# Patient Record
Sex: Male | Born: 1990 | Race: White | Hispanic: No | Marital: Single | Smoking: Never smoker
Health system: Southern US, Community
[De-identification: ages and names within clinical notes are randomized; demographics above are authoritative.]

## PROBLEM LIST (undated history)

## (undated) DIAGNOSIS — F319 Bipolar disorder, unspecified: Secondary | ICD-10-CM

## (undated) DIAGNOSIS — F419 Anxiety disorder, unspecified: Secondary | ICD-10-CM

## (undated) DIAGNOSIS — F1910 Other psychoactive substance abuse, uncomplicated: Secondary | ICD-10-CM

## (undated) DIAGNOSIS — F32A Depression, unspecified: Secondary | ICD-10-CM

## (undated) DIAGNOSIS — F909 Attention-deficit hyperactivity disorder, unspecified type: Secondary | ICD-10-CM

## (undated) HISTORY — DX: Depression, unspecified: F32.A

## (undated) HISTORY — DX: Other psychoactive substance abuse, uncomplicated: F19.10

## (undated) HISTORY — DX: Anxiety disorder, unspecified: F41.9

## (undated) HISTORY — DX: Bipolar disorder, unspecified: F31.9

## (undated) HISTORY — DX: Attention-deficit hyperactivity disorder, unspecified type: F90.9

---
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
## 2015-02-08 ENCOUNTER — Ambulatory Visit (INDEPENDENT_AMBULATORY_CARE_PROVIDER_SITE_OTHER): Payer: BLUE CROSS/BLUE SHIELD | Admitting: Physician Assistant

## 2015-02-08 DIAGNOSIS — F988 Other specified behavioral and emotional disorders with onset usually occurring in childhood and adolescence: Secondary | ICD-10-CM | POA: Insufficient documentation

## 2015-02-08 DIAGNOSIS — F909 Attention-deficit hyperactivity disorder, unspecified type: Secondary | ICD-10-CM

## 2015-02-08 DIAGNOSIS — L649 Androgenic alopecia, unspecified: Secondary | ICD-10-CM | POA: Insufficient documentation

## 2015-02-08 MED ORDER — AMPHETAMINE-DEXTROAMPHETAMINE 10 MG PO TABS: 10.0000 mg | ORAL_TABLET | Freq: Two times a day (BID) | ORAL | Status: DC

## 2015-02-08 NOTE — Progress Notes (Signed)
   Jared Delacruz  MRN: [id number] DOB: 1990/12/04  Subjective:  Pt presents to clinic for evaluation of possible ADD medications.  Easily distracted, inattentiveness - trouble getting tasks started and trouble getting them finished -   typical work day - 8-5ish   Was using caffiene some but it started affecting his sleep so he stopped that a while ago.   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  He is not sure when he plans to see him next.   Patient Active Problem List   Diagnosis Date Noted  . 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 ADD (attention deficit disorder) 02/08/2015    No current outpatient prescriptions on file prior to visit.   No current facility-administered medications on file prior to visit.    No Known Allergies  Review of Systems  Psychiatric/Behavioral: Positive for decreased concentration. Negative for dysphoric mood. The patient is not nervous/anxious.    Objective:  BP 128/70 mmHg  Pulse 74  Temp(Src) 99 F (37.2 C) (Oral)  Resp 16  Ht  (1.753 m)  Wt 178 lb 12.8 oz (81.103 kg)  BMI 26.39 kg/m2  SpO2 99%  Physical Exam  Constitutional: He is oriented to person, place, and time and well-developed, well-nourished, and in no distress.  HENT:  Head: Normocephalic and atraumatic.  Right Ear: External ear normal.  Left Ear: External ear normal.  Eyes: Conjunctivae are normal.  Neck: Normal range of motion.  Cardiovascular: Normal rate, regular rhythm and normal heart sounds.   No murmur heard. Pulmonary/Chest: Effort normal and breath sounds normal.  Neurological: He is alert and oriented to person, place, and time. Gait normal.  Skin: Skin is warm and dry.  Psychiatric: Mood,  memory, affect and judgment normal.    Assessment and Plan :  ADD (attention deficit disorder) - Plan: amphetamine-dextroamphetamine (ADDERALL) 10 MG tablet   Start Adderall - titration discussed with patient as well as our controlled substance policy.  He will contact me in 2 weeks to tell me where he is and how he is responding and then he will recheck with me in a month.  Benny Lennert PA-C  [facility] 02/13/2015 9:45 AM

## 2015-02-08 NOTE — Patient Instructions (Signed)
[facility] Policy for Prescribing Controlled Substances (Revised 04/2012) 1. Prescriptions for controlled substances will be filled by ONE provider at [facility] with whom you have established and developed a plan for your care, including follow-up. 2. You are encouraged to schedule an appointment with your prescriber at our appointment center for follow-up visits whenever possible. 3. If you request a prescription for the controlled substance while at [facility] for an acute problem (with someone other than your regular prescriber), you MAY be given a ONE-TIME prescription for a 30-day supply of the controlled substance, to allow time for you to return to see your regular prescriber for additional prescriptions.  Sign up for mychart

## 2015-03-15 ENCOUNTER — Encounter: Payer: Self-pay | Admitting: Physician Assistant

## 2015-03-15 ENCOUNTER — Ambulatory Visit (INDEPENDENT_AMBULATORY_CARE_PROVIDER_SITE_OTHER): Payer: BLUE CROSS/BLUE SHIELD | Admitting: Physician Assistant

## 2015-03-15 VITALS — BP 116/70 | HR 90 | Temp 98.8°F | Resp 16 | Ht 68.75 in | Wt 175.2 lb

## 2015-03-15 DIAGNOSIS — F909 Attention-deficit hyperactivity disorder, unspecified type: Secondary | ICD-10-CM

## 2015-03-15 DIAGNOSIS — F988 Other specified behavioral and emotional disorders with onset usually occurring in childhood and adolescence: Secondary | ICD-10-CM

## 2015-03-15 MED ORDER — AMPHETAMINE-DEXTROAMPHETAMINE 10 MG PO TABS: 10.0000 mg | ORAL_TABLET | Freq: Two times a day (BID) | ORAL | Status: DC

## 2015-03-15 NOTE — Patient Instructions (Addendum)
Continue to titrate the adderall - recheck in a month

## 2015-03-15 NOTE — Progress Notes (Signed)
   Jared Delacruz  MRN: [id number] DOB: April 24, 1991  Subjective:  Pt presents to clinic for ADD recheck.  He has been trying different doses of his adderall and still not completely sure what the best dose is for him and he feels like he needs to do more research.  He has tried  and he feels a difference that lasts about an hour but he has also tried  and that helps a lot more with the same time frame of help.  He has tried adding coffee helps but it messes up his sleep and aggravates his anxiety.  He has found that for his Anxiety - magnesium glycinate   -  helps with anxiety symptoms - he has been taking that for about a month and he has really noticed a difference in his anxiety.   Patient Active Problem List   Diagnosis Date Noted  . 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 ADD (attention deficit disorder) 02/08/2015    Current Outpatient Prescriptions on File Prior to Visit  Medication Sig Dispense Refill  . finasteride (PROPECIA) 1 MG tablet Take 1 mg by mouth daily.     No current facility-administered medications on file prior to visit.    No Known Allergies  Review of Systems  Psychiatric/Behavioral: Positive for decreased concentration. The patient is nervous/anxious.    Objective:  BP 116/70 mmHg  Pulse 90  Temp(Src) 98.8 F (37.1 C) (Oral)  Resp 16  Ht 5' 8.75" (1.746 m)  Wt 175 lb 3.2 oz (79.47 kg)  BMI 26.07 kg/m2  SpO2 97%  Physical Exam  Constitutional: He is oriented to person, place, and time and well-developed, well-nourished, and in no distress.  HENT:  Head: Normocephalic and atraumatic.  Right Ear: External ear normal.  Left Ear: External ear normal.  Eyes: Conjunctivae are normal.  Neck: Normal range of motion.  Pulmonary/Chest: Effort normal.  Neurological: He is alert and oriented to person, place, and time. Gait normal.  Skin: Skin is warm and dry.  Psychiatric: Mood, memory, affect and judgment normal.    Assessment and Plan :  ADD  (attention deficit disorder) - Plan: amphetamine-dextroamphetamine (ADDERALL) 10 MG tablet   Pt plans to continue with his titration to figure out the best dose for him - he can go from  -  up to bid and he will recheck with me in a month  Benny Lennert PA-C  [facility] 03/16/2015 11:43 AM

## 2015-04-19 ENCOUNTER — Telehealth: Payer: Self-pay | Admitting: *Deleted

## 2015-04-19 ENCOUNTER — Ambulatory Visit: Payer: Self-pay | Admitting: Physician Assistant

## 2015-04-19 NOTE — Telephone Encounter (Signed)
Patient missed appt., left message to call the office to resche appt.

## 2015-05-22 ENCOUNTER — Ambulatory Visit (INDEPENDENT_AMBULATORY_CARE_PROVIDER_SITE_OTHER): Payer: BLUE CROSS/BLUE SHIELD | Admitting: Physician Assistant

## 2015-05-22 ENCOUNTER — Encounter: Payer: Self-pay | Admitting: Physician Assistant

## 2015-05-22 VITALS — BP 130/78 | HR 80 | Temp 98.0°F | Resp 16 | Ht 69.0 in | Wt 174.0 lb

## 2015-05-22 DIAGNOSIS — F909 Attention-deficit hyperactivity disorder, unspecified type: Secondary | ICD-10-CM | POA: Diagnosis not present

## 2015-05-22 DIAGNOSIS — F419 Anxiety disorder, unspecified: Secondary | ICD-10-CM | POA: Diagnosis not present

## 2015-05-22 DIAGNOSIS — F988 Other specified behavioral and emotional disorders with onset usually occurring in childhood and adolescence: Secondary | ICD-10-CM

## 2015-05-22 DIAGNOSIS — F331 Major depressive disorder, recurrent, moderate: Secondary | ICD-10-CM | POA: Insufficient documentation

## 2015-05-22 MED ORDER — AMPHETAMINE-DEXTROAMPHETAMINE 10 MG PO TABS: 10.0000 mg | ORAL_TABLET | Freq: Two times a day (BID) | ORAL | Status: DC

## 2015-05-22 NOTE — Progress Notes (Signed)
   Jared SeraCasey Delacruz  MRN: [id number] DOB: 20-Mar-1991  Subjective:  Pt presents to clinic for recheck of his Adderall.  He finds that the Adderall is really helping.  He takes a full dose in the am and then a half dose in the early afternoon.  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  He rarely takes on the weekends.  He has noticed that his appetite has been affected and he is having a hard time eating when he is not hungry but he is still trying to.  Patient Active Problem List   Diagnosis Date Noted  . Anxiety 05/22/2015  . 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 ADD (attention deficit disorder) 02/08/2015    Current Outpatient Prescriptions on File Prior to Visit  Medication Sig Dispense Refill  . finasteride (PROPECIA) 1 MG tablet Take 1 mg by mouth daily.     No current facility-administered medications on file prior to visit.    No Known Allergies  Review of Systems  Constitutional: Positive for appetite change (decreased with the medication SE).  Psychiatric/Behavioral: Positive for decreased concentration (improved). The patient is nervous/anxious (improved).    Objective:  BP 130/78 mmHg  Pulse 80  Temp(Src) 98 F (36.7 C)  Resp 16  Ht 5\' 9"  (1.753 m)  Wt 174 lb (78.926 kg)  BMI 25.68 kg/m2  Physical Exam  Constitutional: He is oriented to person, place, and time and well-developed, well-nourished, and in no distress.  HENT:  Head: Normocephalic and atraumatic.  Right Ear: External ear normal.  Left Ear: External ear normal.  Eyes: Conjunctivae are normal.  Neck: Normal range of motion.  Cardiovascular: Normal rate, regular rhythm and normal heart sounds.   No murmur heard. Pulmonary/Chest: Effort normal and breath sounds normal. He has no wheezes.  Neurological: He is alert and oriented to person, place, and time. Gait normal.  Skin: Skin is warm and dry.  Psychiatric: Mood, memory,  affect and judgment normal.    Assessment and Plan :  ADD (attention deficit disorder) - Plan: amphetamine-dextroamphetamine (ADDERALL) 10 MG tablet  Anxiety   Pt's BP is slightly elevated today.  We will continue to monitor.  He will be mindful of his weight and his decreased appetite.  He will continue the Magnesium glycinate and he will continue his current dosing of Adderall.  He will contact me monthly for refills.  He will recheck with me in 6 months.  Benny LennertSarah Weber PA-C  [facility] 05/22/2015 8:25 AM

## 2015-07-09 ENCOUNTER — Telehealth: Payer: Self-pay

## 2015-07-09 DIAGNOSIS — F988 Other specified behavioral and emotional disorders with onset usually occurring in childhood and adolescence: Secondary | ICD-10-CM

## 2015-07-09 NOTE — Telephone Encounter (Signed)
Pt is needing a refill on adderall   Best number [phone]

## 2015-07-10 MED ORDER — AMPHETAMINE-DEXTROAMPHETAMINE 10 MG PO TABS: 10.0000 mg | ORAL_TABLET | Freq: Two times a day (BID) | ORAL | Status: DC

## 2015-07-10 NOTE — Telephone Encounter (Signed)
Please call pt after putting/checking to make sure Rx is in drawer.

## 2015-07-10 NOTE — Telephone Encounter (Signed)
Pt notified that rx is ready for pickup at 102  

## 2015-07-10 NOTE — Telephone Encounter (Signed)
Done. Ready to pick-up 

## 2015-07-24 ENCOUNTER — Ambulatory Visit (INDEPENDENT_AMBULATORY_CARE_PROVIDER_SITE_OTHER): Payer: No Typology Code available for payment source | Admitting: Family Medicine

## 2015-07-24 VITALS — BP 126/80 | HR 85 | Temp 97.5°F | Resp 16 | Ht 69.5 in | Wt 180.8 lb

## 2015-07-24 DIAGNOSIS — J029 Acute pharyngitis, unspecified: Secondary | ICD-10-CM | POA: Diagnosis not present

## 2015-07-24 DIAGNOSIS — H109 Unspecified conjunctivitis: Secondary | ICD-10-CM | POA: Diagnosis not present

## 2015-07-24 LAB — POCT RAPID STREP A (OFFICE): Rapid Strep A Screen: NEGATIVE

## 2015-07-24 MED ORDER — CIPROFLOXACIN HCL 0.3 % OP SOLN: 1.0000 [drp] | OPHTHALMIC | Status: DC

## 2015-07-24 NOTE — Progress Notes (Signed)
[facility] [address], [place] [address] [phone]  Date:  07/24/2015   Name:  Jared Delacruz   DOB:  Dec 18, 1990   MRN:  [id number]  PCP:  No primary care provider on file.    Chief Complaint: Conjunctivitis   History of Present Illness:  Jared Delacruz is a 25 y.o. very pleasant male patient who presents with the following:  He has been sick for a week or so with a cold- then he developed eye sx 2 days ago and is concerned that he has pinkeye His eyes will be "glued shut" in the am.  The right eye is a bit worse but both eyes are effected.   No corrective lenses  Vision seems ok He does not have eye pain or light sensitivity They do itch and are crusty, lids have been slightly swollen.    He has had a ST for a few days, also some sinus congestion.  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 Anxiety 05/22/2015  . 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 ADD (attention deficit disorder) 02/08/2015    History reviewed. No pertinent past medical history.  History reviewed. No pertinent past surgical history.  Social History  Substance Use Topics  . Smoking status: Never Smoker   . Smokeless tobacco: Never Used  . Alcohol Use: 0.6 oz/week    0 Standard drinks or equivalent, 1 Cans of beer per week     Comment: social monthly - socially    Family History  Problem Relation Age of Onset  . Cancer Father     salivary    No Known Allergies  Medication list has been reviewed and updated.  Current Outpatient Prescriptions on File Prior to Visit  Medication Sig Dispense Refill  . amphetamine-dextroamphetamine (ADDERALL) 10 MG tablet Take 1 tablet (10 mg total) by mouth 2 (two) times daily. 60 tablet 0  . finasteride (PROPECIA) 1 MG tablet Take 1 mg by mouth daily.     No current facility-administered  medications on file prior to visit.    Review of Systems:  As per HPI- otherwise negative.  Physical Examination: Filed Vitals:   07/24/15 0951  BP: 126/80  Pulse: 85  Temp: 97.5 F (36.4 C)  Resp: 16   Filed Vitals:   07/24/15 0951  Height: 5' 9.5" (1.765 m)  Weight: 180 lb 12.8 oz (82.01 kg)   Body mass index is 26.33 kg/(m^2). Ideal Body Weight: Weight in (lb) to have BMI = 25: 171.4  GEN: WDWN, NAD, Non-toxic, A & O x 3, looks well except for eyes HEENT: Atraumatic, Normocephalic. Neck supple. No masses, No LAD.  Bilateral TM wnl, oropharynx normal.  PEERL,EOMI.  Bilateral eyes are injected  Ears and Nose: No external deformity. CV: RRR, No M/G/R. No JVD. No thrill. No extra heart sounds. PULM: CTA B, no wheezes, crackles, rhonchi. No retractions. No resp. distress. No accessory muscle use. EXTR: No c/c/e NEURO Normal gait.  PSYCH: Normally interactive. Conversant. Not depressed or anxious appearing.  Calm demeanor.  Negative fluorescin testing bilaterally.  Normal limited fundoscopic exam  Results for orders placed or performed in visit on 07/24/15  POCT rapid strep A  Result Value Ref Range   Rapid Strep A Screen Negative Negative    Assessment and Plan: Bilateral conjunctivitis -  Plan: ciprofloxacin (CILOXAN) 0.3 % ophthalmic solution  Acute pharyngitis, unspecified etiology - Plan: POCT rapid strep A  Pinkeye- start on ciprofloxacin solution for one week Suspect viral URI is the source of his other symptoms Follow-up if eyes are not better and cautioned regarding contagion  Signed Abbe Amsterdam, MD

## 2015-07-24 NOTE — Patient Instructions (Signed)
We are going to treat your pinkeye with cipro eye drops- use as directed.  If you are not improving in the next 1-2 days please let us know- sooner if you get worse This is contagious so be sure to wash your hands frequently and avoid touching your eyes Your strep is negative so you likely have a viral URI to go along with the pinkeye

## 2015-08-31 ENCOUNTER — Telehealth: Payer: Self-pay

## 2015-08-31 NOTE — Telephone Encounter (Signed)
Patient stated he will be gone for three months. He need a refill for Adderall. He is wondering how he will get the medication  because will be out of town unless the medication can be filled for the three months [phone].

## 2015-08-31 NOTE — Telephone Encounter (Signed)
If he will be out of the country he will need to contact his insurance and they should allow him to get all the 3 months at a time - if he is going to be somewhere else in the [place] I can write him for 3 Rx and he can get them filled as needed.  Let me know where he is going so I can write the Rx correctly

## 2015-09-06 ENCOUNTER — Telehealth: Payer: Self-pay

## 2015-09-06 DIAGNOSIS — F988 Other specified behavioral and emotional disorders with onset usually occurring in childhood and adolescence: Secondary | ICD-10-CM

## 2015-09-06 NOTE — Telephone Encounter (Signed)
[place] [facility].

## 2015-09-07 MED ORDER — AMPHETAMINE-DEXTROAMPHETAMINE 10 MG PO TABS: 10.0000 mg | ORAL_TABLET | Freq: Two times a day (BID) | ORAL | Status: DC

## 2015-09-07 NOTE — Telephone Encounter (Signed)
I have written all 3 months and will have an MD sign them so he hopefully will be able to fill out of state.

## 2015-09-10 NOTE — Telephone Encounter (Signed)
Pt advised Rx ready to pick up. 

## 2015-09-10 NOTE — Telephone Encounter (Signed)
Called pt, unable to leave voicemail

## 2015-11-11 ENCOUNTER — Telehealth: Payer: Self-pay | Admitting: Family Medicine

## 2015-11-11 NOTE — Telephone Encounter (Signed)
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  Clarifying rx on three prescriptions written on 09/07/15.  Pt just dropping off 3 rx now.  Pharmacist clarifying which rx to fill.  Provided verbal order to fill 09/07/15 rx now; can fill two remaining rx monthly.  [place] Controlled Substance database reviewed; no other controlled substance activity.  Last Adderall rx filled 07/2015.

## 2016-07-03 ENCOUNTER — Ambulatory Visit (INDEPENDENT_AMBULATORY_CARE_PROVIDER_SITE_OTHER): Payer: BLUE CROSS/BLUE SHIELD | Admitting: Physician Assistant

## 2016-07-03 ENCOUNTER — Encounter: Payer: Self-pay | Admitting: Physician Assistant

## 2016-07-03 VITALS — BP 116/64 | HR 82 | Temp 98.5°F | Ht 69.5 in | Wt 180.4 lb

## 2016-07-03 DIAGNOSIS — L659 Nonscarring hair loss, unspecified: Secondary | ICD-10-CM

## 2016-07-03 DIAGNOSIS — F988 Other specified behavioral and emotional disorders with onset usually occurring in childhood and adolescence: Secondary | ICD-10-CM | POA: Diagnosis not present

## 2016-07-03 DIAGNOSIS — R5383 Other fatigue: Secondary | ICD-10-CM | POA: Diagnosis not present

## 2016-07-03 MED ORDER — AMPHETAMINE-DEXTROAMPHET ER 15 MG PO CP24: 15.0000 mg | ORAL_CAPSULE | ORAL | 0 refills | Status: DC

## 2016-07-03 NOTE — Progress Notes (Signed)
Jared Delacruz Hasler  MRN: [id number] DOB: 12-Jun-1990  Subjective:  Pt presents to clinic for medication refill.  [place] [address] [place]. 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  He feels like the structure would be better for him.  He does not like having to take a booster dose.  He also feels like he crashes on the immediate release.  He feels like the morning 10mg  dose is good and then most days that he works he uses a booster of 5-10mg  -  He has to be careful how late he takes it because he will not sleep at night.  He stopped coffee and that has helped his anxiety so he is not using his magnesium daily  Task that are not time oriented are really hard for him esp home tasks.  He has been on finasteride for hair loss but he noticed that he was having hematospermia and he stopped the medication and it resolved - he would like to now if there was anything else - he has never seen a dermatologist.  He is also interested in a discussion of his fatigue.  He seems to have really low motivation and energy levels - he wants to work out but he gets home and does not have the energy.  He has joined a yoga gym and he hopes the group mentality will help with his motivation.  His mother and grandmother and grandfather all have chronic fatigue syndrome.  It does seem to be worse on the days he takes no Adderall.  Review of Systems  Psychiatric/Behavioral: Positive for decreased concentration. Negative for sleep disturbance.    Patient Active Problem List   Diagnosis Date Noted  . Anxiety 05/22/2015  . 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 ADD (attention deficit disorder) 02/08/2015    No current outpatient prescriptions on file prior to visit.   No current facility-administered medications on file prior to visit.     No Known Allergies  Pt patients past, family and social  history were reviewed and updated.   Objective:  BP 116/64 (BP Location: Right Arm, Patient Position: Sitting, Cuff Size: Small)   Pulse 82   Temp 98.5 F (36.9 C) (Oral)   Ht 5' 9.5" (1.765 m)   Wt 180 lb 6.4 oz (81.8 kg)   SpO2 97%   BMI 26.26 kg/m   Physical Exam  Constitutional: He is oriented to person, place, and time and well-developed, well-nourished, and in no distress.  HENT:  Head: Normocephalic and atraumatic.  Right Ear: External ear normal.  Left Ear: External ear normal.  Eyes: Conjunctivae are normal.  Neck: Normal range of motion.  Pulmonary/Chest: Effort normal.  Neurological: He is alert and oriented to person, place, and time. Gait normal.  Skin: Skin is warm and dry.  Psychiatric: Mood, memory, affect and judgment normal.    Assessment and Plan :  Attention deficit disorder (ADD) without hyperactivity - Plan: amphetamine-dextroamphetamine (ADDERALL XR) 15 MG 24 hr capsule - trial of this medication - he will contact me through mychart with his results.  Fatigue, unspecified type - d/w pt could be metabolic - thyroid or anemia is a possibility but at this time he does not want testing - also discussed the possibility of low energy/low motivation depression and maybe the use of Wellbutrin for this -  he wants to think about it and then he will RTC when he is ready to do something about it.  Hair loss - pt is not interested in a dermatology referral at this time.  Benny Lennert PA-C  [facility] 07/03/2016 3:45 PM

## 2016-07-03 NOTE — Patient Instructions (Signed)
     IF you received an x-ray today, you will receive an invoice from [facility]. Please contact  Radiology at [phone] with questions or concerns regarding your invoice.   IF you received labwork today, you will receive an invoice from [facility]. Please contact [facility] at [phone] with questions or concerns regarding your invoice.   Our billing staff will not be able to assist you with questions regarding bills from these companies.  You will be contacted with the lab results as soon as they are available. The fastest way to get your results is to activate your My Chart account. Instructions are located on the last page of this paperwork. If you have not heard from us regarding the results in 2 weeks, please contact this office.     

## 2016-10-13 DIAGNOSIS — F909 Attention-deficit hyperactivity disorder, unspecified type: Secondary | ICD-10-CM | POA: Diagnosis not present

## 2016-10-13 DIAGNOSIS — Z711 Person with feared health complaint in whom no diagnosis is made: Secondary | ICD-10-CM | POA: Diagnosis not present

## 2016-10-13 DIAGNOSIS — Z7689 Persons encountering health services in other specified circumstances: Secondary | ICD-10-CM | POA: Diagnosis not present

## 2016-10-13 DIAGNOSIS — Z6827 Body mass index (BMI) 27.0-27.9, adult: Secondary | ICD-10-CM | POA: Diagnosis not present

## 2016-10-14 DIAGNOSIS — Z113 Encounter for screening for infections with a predominantly sexual mode of transmission: Secondary | ICD-10-CM | POA: Diagnosis not present

## 2017-10-08 ENCOUNTER — Ambulatory Visit (INDEPENDENT_AMBULATORY_CARE_PROVIDER_SITE_OTHER): Payer: BLUE CROSS/BLUE SHIELD

## 2017-10-08 ENCOUNTER — Ambulatory Visit: Payer: BLUE CROSS/BLUE SHIELD | Admitting: Physician Assistant

## 2017-10-08 ENCOUNTER — Other Ambulatory Visit: Payer: Self-pay

## 2017-10-08 ENCOUNTER — Encounter: Payer: Self-pay | Admitting: Physician Assistant

## 2017-10-08 VITALS — BP 104/64 | HR 76 | Temp 98.1°F | Resp 16 | Ht 69.25 in | Wt 168.8 lb

## 2017-10-08 DIAGNOSIS — G8929 Other chronic pain: Secondary | ICD-10-CM

## 2017-10-08 DIAGNOSIS — M545 Low back pain, unspecified: Secondary | ICD-10-CM

## 2017-10-08 DIAGNOSIS — M5416 Radiculopathy, lumbar region: Secondary | ICD-10-CM | POA: Diagnosis not present

## 2017-10-08 DIAGNOSIS — L659 Nonscarring hair loss, unspecified: Secondary | ICD-10-CM | POA: Diagnosis not present

## 2017-10-08 DIAGNOSIS — F988 Other specified behavioral and emotional disorders with onset usually occurring in childhood and adolescence: Secondary | ICD-10-CM

## 2017-10-08 DIAGNOSIS — M549 Dorsalgia, unspecified: Secondary | ICD-10-CM | POA: Diagnosis not present

## 2017-10-08 MED ORDER — AMPHETAMINE-DEXTROAMPHET ER 15 MG PO CP24: 15.0000 mg | ORAL_CAPSULE | ORAL | 0 refills | Status: DC

## 2017-10-08 MED ORDER — MELOXICAM 7.5 MG PO TABS: 7.5000 mg | ORAL_TABLET | Freq: Every day | ORAL | 0 refills | Status: DC

## 2017-10-08 MED ORDER — FINASTERIDE 5 MG PO TABS: 2.5000 mg | ORAL_TABLET | Freq: Every day | ORAL | 1 refills | Status: DC

## 2017-10-08 NOTE — Patient Instructions (Signed)
     IF you received an x-ray today, you will receive an invoice from [facility]. Please contact [facility] at [phone] with questions or concerns regarding your invoice.   IF you received labwork today, you will receive an invoice from [facility]. Please contact [facility] at [phone] with questions or concerns regarding your invoice.   Our billing staff will not be able to assist you with questions regarding bills from these companies.  You will be contacted with the lab results as soon as they are available. The fastest way to get your results is to activate your My Chart account. Instructions are located on the last page of this paperwork. If you have not heard from us regarding the results in 2 weeks, please contact this office.     

## 2017-10-08 NOTE — Progress Notes (Signed)
Jared Delacruz  MRN: [id number] DOB: Jan 09, 1991  PCP: Patient, No Pcp Per  Chief Complaint  Patient presents with  . Back Pain    x 6 months radiating down to LEFT leg  . Medication Refill    ADDERALL and restart Fenastride    Subjective:  Pt presents to clinic for back pain that has pain radiation into the left back leg.  He started having more pain last year - he was driving long periods of time and was still lifting weights.  He is doing home PT and that has helped but the pain is not getting better like he would suspect.  Sitting is when his pain gets the worse.  He sits on a ball at work.  Exercising currently causes pain. No bowel or bladder problems.  Occasional paresthesias in left lateral posterior leg.  He will sometimes get a burning sensation in that area. No imaging done on his back.  He has been doing home PT for 6 weeks.  He is not longer able to run because of increased pain afterwards for days.  He was living in [facility] - recently moved back to [place]. 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  He needs to refill his medication of Adderall XR since moving back to [place]. Recently moved into a house which has 3 units that he bought with his brother.  They are doing the work themselves.  He feels like the Adderall dose is working good for him.  He has been using finasteride  - takes 1/4 pill daily -    History is obtained by patient.  Review of Systems  Musculoskeletal: Positive for back pain. Negative for gait problem.  Neurological: Negative for weakness and numbness.    Patient Active Problem List   Diagnosis Date Noted  . Anxiety 05/22/2015  . Moderate episode of recurrent major depressive disorder (HCC) 05/22/2015  . 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 ADD (attention deficit disorder) 02/08/2015    No current outpatient medications on file prior to visit.   No current facility-administered medications on file prior to visit.     No Known Allergies  No past  medical history on file. 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   Social History   Tobacco Use  . Smoking status: Never Smoker  . Smokeless tobacco: Never Used  Substance Use Topics  . Alcohol use: Yes    Alcohol/week: 0.6 oz    Types: 1 Cans of beer per week    Comment: social monthly - socially  . Drug use: No   family history includes Cancer in his father.     Objective:  BP 104/64 (BP Location: Left Arm)   Pulse 76   Temp 98.1 F (36.7 C) (Oral)   Resp 16   Ht 5' 9.25" (1.759 m)   Wt 168 lb 12.8 oz (76.6 kg)   SpO2 98%   BMI 24.75 kg/m  Body mass index is 24.75 kg/m.  Physical Exam  Constitutional: He is oriented to person, place, and time. He appears well-developed and well-nourished.  HENT:  Head: Normocephalic and atraumatic.  Right Ear: External ear normal.  Left Ear: External ear normal.  Eyes: Conjunctivae are normal.  Neck: Normal range of motion.  Pulmonary/Chest: Effort normal.  Musculoskeletal:       Lumbar back: He exhibits pain and spasm. He exhibits  normal range of motion and no tenderness (no TTP over sciatic nerve).       Back:  Neurological: He is alert and oriented to person, place, and time. He has normal strength and normal reflexes. Gait normal.  Skin: Skin is warm and dry.  Psychiatric: Judgment normal.  Vitals reviewed.  Dg Lumbar Spine 2-3 Views  Result Date: 10/08/2017 CLINICAL DATA:  Back and left leg pain. EXAM: LUMBAR SPINE - 2-3 VIEW COMPARISON:  None. FINDINGS: There is no evidence of lumbar spine fracture. Alignment is normal. Intervertebral disc spaces are maintained. IMPRESSION: Negative. Electronically Signed   By: Kennith Center M.D.   On: 10/08/2017 18:24    Assessment and Plan :  Hair loss - Plan: finasteride (PROSCAR) 5 MG tablet -gave refill of medication to allow patient to continue taking this medication daily.  He breaks the  tablet into 4 quarters and takes a quarter each day.  Attention deficit disorder (ADD) without hyperactivity - Plan: amphetamine-dextroamphetamine (ADDERALL XR) 15 MG 24 hr capsule, DISCONTINUED: amphetamine-dextroamphetamine (ADDERALL XR) 15 MG 24 hr capsule -patient has been on this medication with good results for many years we will continue prescribing as I have prescribed for him in the past.  Acute left-sided low back pain without sciatica - Plan: DG Lumbar Spine 2-3 Views  Chronic radicular low back pain - Plan: DG Lumbar Spine 2-3 Views, meloxicam (MOBIC) 7.5 MG tablet, MR Lumbar Spine Wo Contrast  Patient has this back pain for greater than 1 year over the past several months he has been doing intensive physical therapy at home and he has not seen much improvement in his pain.  We will start anti-inflammatory he would like to defer muscle relaxers at this time.  We will attempt to get an MRI as he has radicular symptoms for several months even with negative x-ray due to conservative treatment over the last month.  Benny Lennert PA-C  [facility] at [facility] 10/09/2017 2:30 PM

## 2017-10-09 ENCOUNTER — Encounter: Payer: Self-pay | Admitting: Physician Assistant

## 2017-10-15 DIAGNOSIS — F411 Generalized anxiety disorder: Secondary | ICD-10-CM | POA: Diagnosis not present

## 2017-10-22 DIAGNOSIS — F411 Generalized anxiety disorder: Secondary | ICD-10-CM | POA: Diagnosis not present

## 2017-10-23 ENCOUNTER — Encounter: Payer: Self-pay | Admitting: Family Medicine

## 2017-10-27 ENCOUNTER — Ambulatory Visit
Admission: RE | Admit: 2017-10-27 | Discharge: 2017-10-27 | Disposition: A | Discharge status: Home / Self Care | Payer: BLUE CROSS/BLUE SHIELD | Source: Ambulatory Visit | Attending: Physician Assistant | Admitting: Physician Assistant

## 2017-10-27 DIAGNOSIS — M5416 Radiculopathy, lumbar region: Secondary | ICD-10-CM | POA: Diagnosis not present

## 2017-10-27 DIAGNOSIS — G8929 Other chronic pain: Secondary | ICD-10-CM

## 2017-10-28 ENCOUNTER — Encounter: Payer: Self-pay | Admitting: Family Medicine

## 2017-10-29 DIAGNOSIS — F411 Generalized anxiety disorder: Secondary | ICD-10-CM | POA: Diagnosis not present

## 2017-11-05 DIAGNOSIS — F411 Generalized anxiety disorder: Secondary | ICD-10-CM | POA: Diagnosis not present

## 2017-11-06 ENCOUNTER — Encounter: Payer: Self-pay | Admitting: Physician Assistant

## 2017-11-06 DIAGNOSIS — M545 Low back pain, unspecified: Secondary | ICD-10-CM

## 2017-11-06 DIAGNOSIS — G8929 Other chronic pain: Secondary | ICD-10-CM

## 2017-11-06 DIAGNOSIS — M5126 Other intervertebral disc displacement, lumbar region: Secondary | ICD-10-CM

## 2017-11-06 DIAGNOSIS — M5416 Radiculopathy, lumbar region: Secondary | ICD-10-CM

## 2017-11-11 ENCOUNTER — Encounter: Payer: Self-pay | Admitting: Physician Assistant

## 2017-11-11 DIAGNOSIS — F988 Other specified behavioral and emotional disorders with onset usually occurring in childhood and adolescence: Secondary | ICD-10-CM

## 2017-11-11 MED ORDER — AMPHETAMINE-DEXTROAMPHET ER 15 MG PO CP24
15.0000 mg | ORAL_CAPSULE | ORAL | 0 refills | Status: DC
Start: 2017-11-11 — End: 2017-12-24

## 2017-11-12 DIAGNOSIS — F411 Generalized anxiety disorder: Secondary | ICD-10-CM | POA: Diagnosis not present

## 2017-11-19 DIAGNOSIS — F411 Generalized anxiety disorder: Secondary | ICD-10-CM | POA: Diagnosis not present

## 2017-11-26 DIAGNOSIS — F411 Generalized anxiety disorder: Secondary | ICD-10-CM | POA: Diagnosis not present

## 2017-12-02 ENCOUNTER — Ambulatory Visit (INDEPENDENT_AMBULATORY_CARE_PROVIDER_SITE_OTHER): Payer: BLUE CROSS/BLUE SHIELD | Admitting: Orthopaedic Surgery

## 2017-12-02 ENCOUNTER — Encounter (INDEPENDENT_AMBULATORY_CARE_PROVIDER_SITE_OTHER): Payer: Self-pay | Admitting: Orthopaedic Surgery

## 2017-12-02 DIAGNOSIS — M5126 Other intervertebral disc displacement, lumbar region: Secondary | ICD-10-CM

## 2017-12-02 NOTE — Progress Notes (Signed)
Office Visit Note   Patient: Jared Delacruz           Date of Birth: 09-28-1990           MRN: [id number] Visit Date: 12/02/2017              Requested by: Morrell Riddle, PA-C [address] [place], [place] [address] PCP: Patient, No Pcp Per   Assessment & Plan: Visit Diagnoses:  1. Protrusion of lumbar intervertebral disc     Plan: We reviewed the MRI images and report with patient.  He has some disc desiccation at L5-S1 with left paracentral disc protrusion with slight displacement of the left S1 nerve root.  He is neurologically intact.  We discussed workout activities he should avoid such as military presses squats with weights.  He can resume workout activities as tolerated.  We discussed possible epidural if he has increase in symptoms.  If he develops radicular weakness he will call and let us know.  The rest of his discs show normal hydration and no protrusion.  Etiology of disc degeneration discussed and reviewed.  Follow-up if he has increased symptoms.  Follow-Up Instructions: Return if symptoms worsen or fail to improve.   Orders:  No orders of the defined types were placed in this encounter.  No orders of the defined types were placed in this encounter.     Procedures: No procedures performed   Clinical Data: No additional findings.   Subjective: Chief Complaint  Patient presents with  . 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  He has increased pain after he sits for long period of time.  He has not been working out much recently.  He denies any acute injury.  He is to lift some weights and has been less physically active with his back and left leg pain that radiates down to his left lateral calf.  Occasionally has had numbness intermittently in his lateral 3 toes on the left no symptoms on the right.  MRI scan has been obtained by his PCP and is available for review.  No  fever chills no associated bowel bladder symptoms.  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  Review of Systems positive for ADD, anxiety, past history of depression.   Objective: Vital Signs: BP 131/75   Pulse 73   Ht 5' 9.5" (1.765 m)   Wt 175 lb (79.4 kg)   BMI 25.47 kg/m   Physical Exam  Constitutional: He is oriented to person, place, and time. He appears well-developed and well-nourished.  HENT:  Head: Normocephalic and atraumatic.  Eyes: Pupils are equal, round, and reactive to light. EOM are normal.  Neck: No tracheal deviation present. No thyromegaly present.  Cardiovascular: Normal rate.  Pulmonary/Chest: Effort normal. He has no wheezes.  Abdominal: Soft. Bowel sounds are normal.  Neurological: He is alert and oriented to person, place, and time.  Skin: Skin is warm and dry. Capillary refill takes less than 2 seconds.  Psychiatric: He has a normal mood and affect. His behavior is normal. Judgment and thought content normal.    Ortho Exam patient is able to heel and toe walk.  Patient has negative straight leg raising 90 degrees right and left normal hip range of motion.  Knee and ankle jerk is 2+ and symmetrical.  He can do 20 calf raises without touching his heel with good symmetry and speed.  Slight decreased sensation lateral calf.  No calf atrophy. Specialty Comments:  No specialty comments available.  Imaging: CLINICAL DATA:  Low back pain, left leg pain and numbness  EXAM: MRI LUMBAR SPINE WITHOUT CONTRAST  TECHNIQUE: Multiplanar, multisequence MR imaging of the lumbar spine was performed. No intravenous contrast was administered.  COMPARISON:  None.  FINDINGS: Segmentation:  Standard.  Alignment:  Physiologic.  Vertebrae:  No fracture, evidence of discitis, or bone lesion.  Conus medullaris and cauda equina: Conus extends to the T12  level. Conus and cauda equina appear normal.  Paraspinal and other soft tissues: No acute paraspinal abnormality.  Disc levels:  Disc spaces: Degenerative disc disease with disc height loss at L5-S1.  T12-L1: No significant disc bulge. No evidence of neural foraminal stenosis. No central canal stenosis.  L1-L2: No significant disc bulge. No evidence of neural foraminal stenosis. No central canal stenosis.  L2-L3: No significant disc bulge. No evidence of neural foraminal stenosis. No central canal stenosis.  L3-L4: No significant disc bulge. No evidence of neural foraminal stenosis. No central canal stenosis.  L4-L5: No significant disc bulge. No evidence of neural foraminal stenosis. No central canal stenosis.  L5-S1: Small central disc protrusion in close proximity to bilateral intraspinal S1 nerve roots. No evidence of neural foraminal stenosis. No central canal stenosis.  IMPRESSION: 1. Degenerative disc disease with disc height loss at L5-S1 with a small central disc protrusion.   Electronically Signed   By: Elige KoHetal  Patel   On: 10/27/2017 09:26   PMFS History: Patient Active Problem List   Diagnosis Date Noted  . Protrusion of lumbar intervertebral disc 12/02/2017  . Anxiety 05/22/2015  . Moderate episode of recurrent major depressive disorder (HCC) 05/22/2015  . 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 ADD (attention deficit disorder) 02/08/2015   No past medical history on file.  Family History  Problem Relation Age of Onset  . Cancer Father        salivary    No past surgical history on file. Social History   Occupational History  . Not on file  Tobacco Use  . Smoking status: Never Smoker  . Smokeless tobacco: Never Used  Substance and Sexual Activity  . Alcohol use: Yes    Alcohol/week: 0.6 oz    Types: 1 Cans of beer per week    Comment: social monthly - socially  . Drug use: No  . Sexual activity: Yes    Birth control/protection:  Condom

## 2017-12-17 DIAGNOSIS — F411 Generalized anxiety disorder: Secondary | ICD-10-CM | POA: Diagnosis not present

## 2017-12-23 DIAGNOSIS — F411 Generalized anxiety disorder: Secondary | ICD-10-CM | POA: Diagnosis not present

## 2017-12-24 ENCOUNTER — Other Ambulatory Visit: Payer: Self-pay | Admitting: Physician Assistant

## 2017-12-24 DIAGNOSIS — F988 Other specified behavioral and emotional disorders with onset usually occurring in childhood and adolescence: Secondary | ICD-10-CM

## 2017-12-24 NOTE — Telephone Encounter (Signed)
Patient is requesting a refill of the following medications: Requested Prescriptions   Pending Prescriptions Disp Refills  . amphetamine-dextroamphetamine (ADDERALL XR) 15 MG 24 hr capsule 30 capsule 0    Sig: Take 1 capsule by mouth every morning.    Date of patient request: 12/24/2017 Last office visit: 10/08/2017 Date of last refill: 11/11/2017 Last refill amount: 30 Follow up time period per chart:

## 2017-12-29 MED ORDER — AMPHETAMINE-DEXTROAMPHET ER 15 MG PO CP24: 15.0000 mg | ORAL_CAPSULE | ORAL | 0 refills | Status: DC

## 2018-01-14 DIAGNOSIS — F411 Generalized anxiety disorder: Secondary | ICD-10-CM | POA: Diagnosis not present

## 2018-01-20 DIAGNOSIS — F411 Generalized anxiety disorder: Secondary | ICD-10-CM | POA: Diagnosis not present

## 2018-01-28 DIAGNOSIS — F411 Generalized anxiety disorder: Secondary | ICD-10-CM | POA: Diagnosis not present

## 2018-02-11 DIAGNOSIS — F411 Generalized anxiety disorder: Secondary | ICD-10-CM | POA: Diagnosis not present

## 2018-02-26 ENCOUNTER — Other Ambulatory Visit: Payer: Self-pay | Admitting: Physician Assistant

## 2018-02-26 DIAGNOSIS — F988 Other specified behavioral and emotional disorders with onset usually occurring in childhood and adolescence: Secondary | ICD-10-CM

## 2018-02-26 MED ORDER — AMPHETAMINE-DEXTROAMPHET ER 15 MG PO CP24: 15.0000 mg | ORAL_CAPSULE | ORAL | 0 refills | Status: DC

## 2018-02-26 NOTE — Telephone Encounter (Signed)
pmp reviewed Med refilled Please schedule patient to establish care as patient's previous pcp in longer with this practice. thanks

## 2018-04-13 ENCOUNTER — Other Ambulatory Visit: Payer: Self-pay | Admitting: Family Medicine

## 2018-04-13 DIAGNOSIS — F988 Other specified behavioral and emotional disorders with onset usually occurring in childhood and adolescence: Secondary | ICD-10-CM

## 2018-04-14 ENCOUNTER — Other Ambulatory Visit: Payer: Self-pay | Admitting: Family Medicine

## 2018-04-14 DIAGNOSIS — F988 Other specified behavioral and emotional disorders with onset usually occurring in childhood and adolescence: Secondary | ICD-10-CM

## 2018-04-16 ENCOUNTER — Other Ambulatory Visit: Payer: Self-pay | Admitting: Family Medicine

## 2018-04-16 DIAGNOSIS — F988 Other specified behavioral and emotional disorders with onset usually occurring in childhood and adolescence: Secondary | ICD-10-CM

## 2018-04-16 NOTE — Telephone Encounter (Signed)
Patient is requesting a refill of the following medications: Requested Prescriptions   Pending Prescriptions Disp Refills  . amphetamine-dextroamphetamine (ADDERALL XR) 15 MG 24 hr capsule 30 capsule 0    Sig: Take 1 capsule by mouth every morning.

## 2018-04-19 ENCOUNTER — Telehealth: Payer: Self-pay | Admitting: Family Medicine

## 2018-04-19 MED ORDER — AMPHETAMINE-DEXTROAMPHET ER 15 MG PO CP24: 15.0000 mg | ORAL_CAPSULE | ORAL | 0 refills | Status: DC

## 2018-04-19 NOTE — Telephone Encounter (Signed)
Tried to call patient to schedule them with Dr. Leretha Pol for an OV for med refills. If patient calls back, please make them an appt. ASAP.

## 2018-04-19 NOTE — Telephone Encounter (Signed)
pmp reviewed In sept I had requested he be scheduled as pcp left the practice He was never contacted from our office Therefore refilling again Spoke directly with scheduling

## 2018-05-11 ENCOUNTER — Encounter (INDEPENDENT_AMBULATORY_CARE_PROVIDER_SITE_OTHER): Payer: Self-pay | Admitting: Orthopaedic Surgery

## 2018-05-11 NOTE — Telephone Encounter (Signed)
Copied from CRM [id number]. Topic: Quick Communication - Rx Refill/Question >> May 11, 2018 12:43 PM Jilda Rocheemaray, Melissa wrote: Medication: finasteride (PROSCAR) 5 MG tablet   Has the patient contacted their pharmacy? No. (Agent: If no, request that the patient contact the pharmacy for the refill.) (Agent: If yes, when and what did the pharmacy advise?)  Preferred Pharmacy (with phone number or street name): [id number] - [place], [place] - [address] [facility] [address] [facility] [address] [phone] (Phone) [phone] (Fax)    Agent: Please be advised that RX refills may take up to 3 business days. We ask that you follow-up with your pharmacy.

## 2018-05-12 ENCOUNTER — Encounter: Payer: Self-pay | Admitting: Emergency Medicine

## 2018-05-14 NOTE — Telephone Encounter (Signed)
I have never seen this patient.  Will not prescribe medications at this time.  Thanks.

## 2018-05-26 ENCOUNTER — Other Ambulatory Visit: Payer: Self-pay

## 2018-05-26 ENCOUNTER — Encounter: Payer: Self-pay | Admitting: Emergency Medicine

## 2018-05-26 ENCOUNTER — Ambulatory Visit: Payer: BLUE CROSS/BLUE SHIELD | Admitting: Emergency Medicine

## 2018-05-26 VITALS — BP 122/73 | HR 96 | Temp 97.9°F | Resp 18 | Ht 69.84 in | Wt 170.4 lb

## 2018-05-26 DIAGNOSIS — L649 Androgenic alopecia, unspecified: Secondary | ICD-10-CM | POA: Diagnosis not present

## 2018-05-26 DIAGNOSIS — L659 Nonscarring hair loss, unspecified: Secondary | ICD-10-CM

## 2018-05-26 DIAGNOSIS — F988 Other specified behavioral and emotional disorders with onset usually occurring in childhood and adolescence: Secondary | ICD-10-CM

## 2018-05-26 MED ORDER — AMPHETAMINE-DEXTROAMPHET ER 15 MG PO CP24: 15.0000 mg | ORAL_CAPSULE | ORAL | 0 refills | Status: DC

## 2018-05-26 MED ORDER — FINASTERIDE 5 MG PO TABS: 2.5000 mg | ORAL_TABLET | Freq: Every day | ORAL | 1 refills | Status: AC

## 2018-05-26 NOTE — Patient Instructions (Addendum)
   If you have lab work done today you will be contacted with your lab results within the next 2 weeks.  If you have not heard from us then please contact us. The fastest way to get your results is to register for My Chart.   IF you received an x-ray today, you will receive an invoice from [facility]. Please contact [facility] at [phone] with questions or concerns regarding your invoice.   IF you received labwork today, you will receive an invoice from [facility]. Please contact [facility] at [phone] with questions or concerns regarding your invoice.   Our billing staff will not be able to assist you with questions regarding bills from these companies.  You will be contacted with the lab results as soon as they are available. The fastest way to get your results is to activate your My Chart account. Instructions are located on the last page of this paperwork. If you have not heard from us regarding the results in 2 weeks, please contact this office.      Living With Attention Deficit Hyperactivity Disorder If you have been diagnosed with attention deficit hyperactivity disorder (ADHD), you may be relieved that you now know why you have felt or behaved a certain way. Still, you may feel overwhelmed about the treatment ahead. You may also wonder how to get the support you need and how to deal with the condition day-to-day. With treatment and support, you can live with ADHD and manage your symptoms. How to manage lifestyle changes Managing stress Stress is your body's reaction to life changes and events, both good and bad. To cope with the stress of an ADHD diagnosis, it may help to:  Learn more about ADHD.  Exercise regularly. Even a short daily walk can lower stress levels.  Participate in training or education programs (including social skills training classes) that teach you to deal with symptoms.  Medicines Your health care provider may suggest certain  medicines if he or she feels that they will help to improve your condition. Stimulant medicines are usually prescribed to treat ADHD, and therapy may also be prescribed. It is important to:  Avoid using alcohol and other substances that may prevent your medicines from working properly (mayinteract).  Talk with your pharmacist or health care provider about all the medicines that you take, their possible side effects, and what medicines are safe to take together.  Make it your goal to take part in all treatment decisions (shared decision-making). Ask about possible side effects of medicines that your health care provider recommends, and tell him or her how you feel about having those side effects. It is best if shared decision-making with your health care provider is part of your total treatment plan. Relationships To strengthen your relationships with family members while treating your condition, consider taking part in family therapy. You might also attend self-help groups alone or with a loved one. Be honest about how your symptoms affect your relationships. Make an effort to communicate respectfully instead of fighting, and find ways to show others that you care. Psychotherapy may be useful in helping you cope with how ADHD affects your relationships. How to recognize changes in your condition The following signs may mean that your treatment is working well and your condition is improving:  Consistently being on time for appointments.  Being more organized at home and work.  Other people noticing improvements in your behavior.  Achieving goals that you set for yourself.  Thinking more   clearly. The following signs may mean that your treatment is not working very well:  Feeling impatience or more confusion.  Missing, forgetting, or being late for appointments.  An increasing sense of disorganization and messiness.  More difficulty in reaching goals that you set for yourself.  Loved  ones becoming angry or frustrated with you. Where to find support Talking to others  Keep emotion out of important discussions and speak in a calm, logical way.  Listen closely and patiently to your loved ones. Try to understand their point of view, and try to avoid getting defensive.  Take responsibility for the consequences of your actions.  Ask that others do not take your behaviors personally.  Aim to solve problems as they come up, and express your feelings instead of bottling them up.  Talk openly about what you need from your loved ones and how they can support you.  Consider going to family therapy sessions or having your family meet with a specialist who deals with ADHD-related behavior problems. Finances Not all insurance plans cover mental health care, so it is important to check with your insurance carrier. If paying for co-pays or counseling services is a problem, search for a local or county mental health care center. Public mental health care services may be offered there at a low cost or no cost when you are not able to see a private health care provider. If you are taking medicine for ADHD, you may be able to get the generic form, which may be less expensive than brand-name medicine. Some makers of prescription medicines also offer help to patients who cannot afford the medicines that they need. Follow these instructions at home:  Take over-the-counter and prescription medicines only as told by your health care provider. Check with your health care provider before taking any new medicines.  Create structure and an organized atmosphere at home. For example: ? Make a list of tasks, then rank them from most important to least important. Work on one task at a time until your listed tasks are done. ? Make a daily schedule and follow it consistently every day. ? Use an appointment calendar, and check it 2 or 3 times a day to keep on track. Keep it with you when you leave the  house. ? Create spaces where you keep certain things, and always put things back in their places after you use them.  Keep all follow-up visits as told by your health care provider. This is important. Questions to ask your health care provider:  What are the risks and benefits of taking medicines?  Would I benefit from therapy?  How often should I follow up with a health care provider? Contact a health care provider if:  You have side effects from your medicines, such as: ? Repeated muscle twitches, coughing, or speech outbursts. ? Sleep problems. ? Loss of appetite. ? Depression. ? New or worsening behavior problems. ? Dizziness. ? Unusually fast heartbeat. ? Stomach pains. ? Headaches. Get help right away if:  You have a severe reaction to a medicine.  Your behavior suddenly gets worse. Summary  With treatment and support, you can live with ADHD and manage your symptoms.  The medicines that are most often prescribed for ADHD are stimulants.  Consider taking part in family therapy or self-help groups with family members or friends.  When you talk with friends and family about your ADHD, be patient and communicate openly.  Take over-the-counter and prescription medicines only as told by   your health care provider. Check with your health care provider before taking any new medicines. This information is not intended to replace advice given to you by your health care provider. Make sure you discuss any questions you have with your health care provider. Document Released: 09/25/2016 Document Revised: 09/25/2016 Document Reviewed: 09/25/2016 Elsevier Interactive Patient Education  2019 [employer]  

## 2018-05-26 NOTE — Progress Notes (Signed)
Jared Delacruz 27 y.o.   Chief Complaint  Patient presents with  . Depression  . Anxiety  . Medication Refill    adderall xr and proscar   Depression screen [facility] Delacruz 2/9 05/26/2018 10/08/2017 07/03/2016 07/24/2015 05/22/2015  Decreased Interest 0 1 0 0 0  Down, Depressed, Hopeless 1 1 0 0 0  PHQ - 2 Score 1 2 0 0 0  Altered sleeping 0 1 - - -  Tired, decreased energy 0 2 - - -  Change in appetite 0 1 - - -  Feeling bad or failure about yourself  0 2 - - -  Trouble concentrating 1 3 - - -  Moving slowly or fidgety/restless 1 3 - - -  Suicidal thoughts 0 0 - - -  PHQ-9 Score 3 14 - - -  Difficult doing work/chores Somewhat difficult - - - -    HISTORY OF PRESENT ILLNESS: This is a 27 y.o. male with history of ADHD, has been on Adderall for 3 years, also has a history of anxiety/depression.  First visit with me.  Has been seen PA Weber requesting medication refill.  No formal psychiatric evaluation. Also has a history of hair loss, has been on Proscar 2.5 mg daily for the past couple years.  No dermatology evaluation.  HPI   Prior to Admission medications   Medication Sig Start Date End Date Taking? Authorizing Provider  amphetamine-dextroamphetamine (ADDERALL XR) 15 MG 24 hr capsule Take 1 capsule by mouth every morning. 04/19/18  Yes Myles Lipps, MD  finasteride (PROSCAR) 5 MG tablet Take 0.5 tablets (2.5 mg total) by mouth daily. 10/08/17 10/08/18 Yes Weber, Dema Severin, PA-C  meloxicam (MOBIC) 7.5 MG tablet Take 1-2 tablets (7.5-15 mg total) by mouth daily. 10/08/17  Yes Weber, Dema Severin, PA-C    No Known Allergies  Patient Active Problem List   Diagnosis Date Noted  . Protrusion of lumbar intervertebral disc 12/02/2017  . Anxiety 05/22/2015  . Moderate episode of recurrent major depressive disorder (HCC) 05/22/2015  . 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 ADD (attention deficit disorder) 02/08/2015    History reviewed. No pertinent past medical history.  History reviewed. No  pertinent surgical history.  Social History   Socioeconomic History  . Marital status: Single    Spouse name: Not on file  . Number of children: 0  . Years of education: Not on file  . Highest education level: Not on file  Occupational History  . Not on file  Social Needs  . Financial resource strain: Not on file  . Food insecurity:    Worry: Not on file    Inability: Not on file  . Transportation needs:    Medical: Not on file    Non-medical: Not on file  Tobacco Use  . Smoking status: Never Smoker  . Smokeless tobacco: Never Used  Substance and Sexual Activity  . Alcohol use: Yes    Alcohol/week: 1.0 standard drinks    Types: 1 Cans of beer per week    Comment: social monthly - socially  . Drug use: No  . Sexual activity: Yes    Birth control/protection: Condom  Lifestyle  . Physical activity:    Days per week: Not on file    Minutes per session: Not on file  . Stress: Not on file  Relationships  . Social connections:    Talks on phone: Not on file    Gets together: Not on file    Attends religious service:  Not on file    Active member of club or organization: Not on file    Attends meetings of clubs or organizations: Not on file    Relationship status: Not on file  . Intimate partner violence:    Fear of current or ex partner: Not on file    Emotionally abused: Not on file    Physically abused: Not on file    Forced sexual activity: Not on file  Other Topics Concern  . 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    Family History  Problem Relation Age of Onset  . Cancer Father        salivary     Review of Systems  Constitutional: Negative.  Negative for chills, fever and weight loss.  HENT: Negative.   Eyes: Negative.  Negative for blurred vision and double vision.  Respiratory: Negative for shortness of breath.   Cardiovascular: Negative.  Negative for chest pain  and palpitations.  Gastrointestinal: Negative.  Negative for abdominal pain, diarrhea, nausea and vomiting.  Genitourinary: Negative.   Neurological: Negative.  Negative for dizziness and headaches.  Endo/Heme/Allergies: Negative.   Psychiatric/Behavioral: Positive for depression. Negative for suicidal ideas. The patient is nervous/anxious.   All other systems reviewed and are negative.   Vitals:   05/26/18 0927  BP: 122/73  Pulse: 96  Resp: 18  Temp: 97.9 F (36.6 C)  SpO2: 96%    Physical Exam Vitals signs reviewed.  Constitutional:      Appearance: Normal appearance.  HENT:     Head: Normocephalic and atraumatic.  Eyes:     Extraocular Movements: Extraocular movements intact.     Pupils: Pupils are equal, round, and reactive to light.  Neck:     Musculoskeletal: Normal range of motion.  Cardiovascular:     Rate and Rhythm: Normal rate and regular rhythm.  Pulmonary:     Effort: Pulmonary effort is normal.     Breath sounds: Normal breath sounds.  Musculoskeletal: Normal range of motion.  Skin:    General: Skin is warm and dry.  Neurological:     General: No focal deficit present.     Mental Status: He is alert and oriented to person, place, and time.  Psychiatric:        Mood and Affect: Mood normal.        Behavior: Behavior normal.    A total of 25 minutes was spent in the room with the patient, greater than 50% of which was in counseling/coordination of care regarding chronic medical problems including ADHD and need for follow-up with attention specialist Dr.  Also advised about medications in particular side effects associated with Proscar.  Referred to both [place] [facility] and dermatologist.   ASSESSMENT & PLAN: [name] was seen today for depression, anxiety and medication refill.  Diagnoses and all orders for this visit:  Attention deficit disorder (ADD) without hyperactivity -     Ambulatory referral to Psychiatry -     Discontinue:  amphetamine-dextroamphetamine (ADDERALL XR) 15 MG 24 hr capsule; Take 1 capsule by mouth every morning. -     Discontinue: amphetamine-dextroamphetamine (ADDERALL XR) 15 MG 24 hr capsule; Take 1 capsule by mouth every morning. -     amphetamine-dextroamphetamine (ADDERALL XR) 15 MG 24 hr capsule; Take 1 capsule by mouth every morning.  Hair loss -     Ambulatory  referral to Dermatology -     finasteride (PROSCAR) 5 MG tablet; Take 0.5 tablets (2.5 mg total) by mouth daily.  Male pattern baldness    Patient Instructions       If you have lab work done today you will be contacted with your lab results within the next 2 weeks.  If you have not heard from [place] then please contact us. The fastest way to get your results is to register for My Chart.   IF you received an x-ray today, you will receive an invoice from [facility]. Please contact [facility] at [phone] with questions or concerns regarding your invoice.   IF you received labwork today, you will receive an invoice from [place]. Please contact [facility] at [phone] with questions or concerns regarding your invoice.   Our billing staff will not be able to assist you with questions regarding bills from these companies.  You will be contacted with the lab results as soon as they are available. The fastest way to get your results is to activate your My Chart account. Instructions are located on the last page of this paperwork. If you have not heard from [place] regarding the results in 2 weeks, please contact this office.     Living With Attention Deficit Hyperactivity Disorder If you have been diagnosed with attention deficit hyperactivity disorder (ADHD), you may be relieved that you now know why you have felt or behaved a certain way. Still, you may feel overwhelmed about the treatment ahead. You may also wonder how to get the support you need and how to deal with the condition day-to-day. With treatment and  support, you can live with ADHD and manage your symptoms. How to manage lifestyle changes Managing stress Stress is your body's reaction to life changes and events, both good and bad. To cope with the stress of an ADHD diagnosis, it may help to:  Learn more about ADHD.  Exercise regularly. Even a short daily walk can lower stress levels.  Participate in training or education programs (including social skills training classes) that teach you to deal with symptoms.  Medicines Your health care provider may suggest certain medicines if he or she feels that they will help to improve your condition. Stimulant medicines are usually prescribed to treat ADHD, and therapy may also be prescribed. It is important to:  Avoid using alcohol and other substances that may prevent your medicines from working properly [facility]).  Talk with your pharmacist or health care provider about all the medicines that you take, their possible side effects, and what medicines are safe to take together.  Make it your goal to take part in all treatment decisions (shared decision-making). Ask about possible side effects of medicines that your health care provider recommends, and tell him or her how you feel about having those side effects. It is best if shared decision-making with your health care provider is part of your total treatment plan. Relationships To strengthen your relationships with family members while treating your condition, consider taking part in family therapy. You might also attend self-help groups alone or with a loved one. Be honest about how your symptoms affect your relationships. Make an effort to communicate respectfully instead of fighting, and find ways to show others that you care. Psychotherapy may be useful in helping you cope with how ADHD affects your relationships. How to recognize changes in your condition The following signs may mean that your treatment is working well and your  condition is improving:  Consistently being on time for appointments.  Being more organized at home and work.  Other people noticing improvements in your behavior.  Achieving goals that you set for yourself.  Thinking more clearly. The following signs may mean that your treatment is not working very well:  Feeling impatience or more confusion.  Missing, forgetting, or being late for appointments.  An increasing sense of disorganization and messiness.  More difficulty in reaching goals that you set for yourself.  Loved ones becoming angry or frustrated with you. Where to find support Talking to others  Keep emotion out of important discussions and speak in a calm, logical way.  Listen closely and patiently to your loved ones. Try to understand their point of view, and try to avoid getting defensive.  Take responsibility for the consequences of your actions.  Ask that others do not take your behaviors personally.  Aim to solve problems as they come up, and express your feelings instead of bottling them up.  Talk openly about what you need from your loved ones and how they can support you.  Consider going to family therapy sessions or having your family meet with a specialist who deals with ADHD-related behavior problems. Finances Not all insurance plans cover mental health care, so it is important to check with your insurance carrier. If paying for co-pays or counseling services is a problem, search for a local or county mental health care center. Public mental health care services may be offered there at a low cost or no cost when you are not able to see a private health care provider. If you are taking medicine for ADHD, you may be able to get the generic form, which may be less expensive than brand-name medicine. Some makers of prescription medicines also offer help to patients who cannot afford the medicines that they need. Follow these instructions at home:  Take  over-the-counter and prescription medicines only as told by your health care provider. Check with your health care provider before taking any new medicines.  Create structure and an organized atmosphere at home. For example: ? Make a list of tasks, then rank them from most important to least important. Work on one task at a time until your listed tasks are done. ? Make a daily schedule and follow it consistently every day. ? Use an appointment calendar, and check it 2 or 3 times a day to keep on track. Keep it with you when you leave the house. ? Create spaces where you keep certain things, and always put things back in their places after you use them.  Keep all follow-up visits as told by your health care provider. This is important. Questions to ask your health care provider:  What are the risks and benefits of taking medicines?  Would I benefit from therapy?  How often should I follow up with a health care provider? Contact a health care provider if:  You have side effects from your medicines, such as: ? Repeated muscle twitches, coughing, or speech outbursts. ? Sleep problems. ? Loss of appetite. ? Depression. ? New or worsening behavior problems. ? Dizziness. ? Unusually fast heartbeat. ? Stomach pains. ? Headaches. Get help right away if:  You have a severe reaction to a medicine.  Your behavior suddenly gets worse. Summary  With treatment and support, you can live with ADHD and manage your symptoms.  The medicines that are most often prescribed for ADHD are stimulants.  Consider taking part in family therapy or self-help  groups with family members or friends.  When you talk with friends and family about your ADHD, be patient and communicate openly.  Take over-the-counter and prescription medicines only as told by your health care provider. Check with your health care provider before taking any new medicines. This information is not intended to replace advice given  to you by your health care provider. Make sure you discuss any questions you have with your health care provider. Document Released: 09/25/2016 Document Revised: 09/25/2016 Document Reviewed: 09/25/2016 Elsevier Interactive Patient Education  2019 Elsevier Inc.      Edwina BarthMiguel Lianna Sitzmann, MD [facility]

## 2018-06-29 ENCOUNTER — Telehealth: Payer: Self-pay | Admitting: Emergency Medicine

## 2018-06-29 NOTE — Telephone Encounter (Signed)
PER [place] [facility] SPECIAL[facility] FAX.Marland Kitchen THEY HAVE TRIED TO CONTACT PATIENT 3X TO SCHEDULE APPNT AND SEND PAPERWORK 06/29/18

## 2019-08-28 IMAGING — DX DG LUMBAR SPINE 2-3V
3 series · 3 of 3 positions shown · non-contrast
Comparison: None.

CLINICAL DATA: Back and left leg pain.

EXAM:
LUMBAR SPINE - 2-3 VIEW

[l-spine ap]
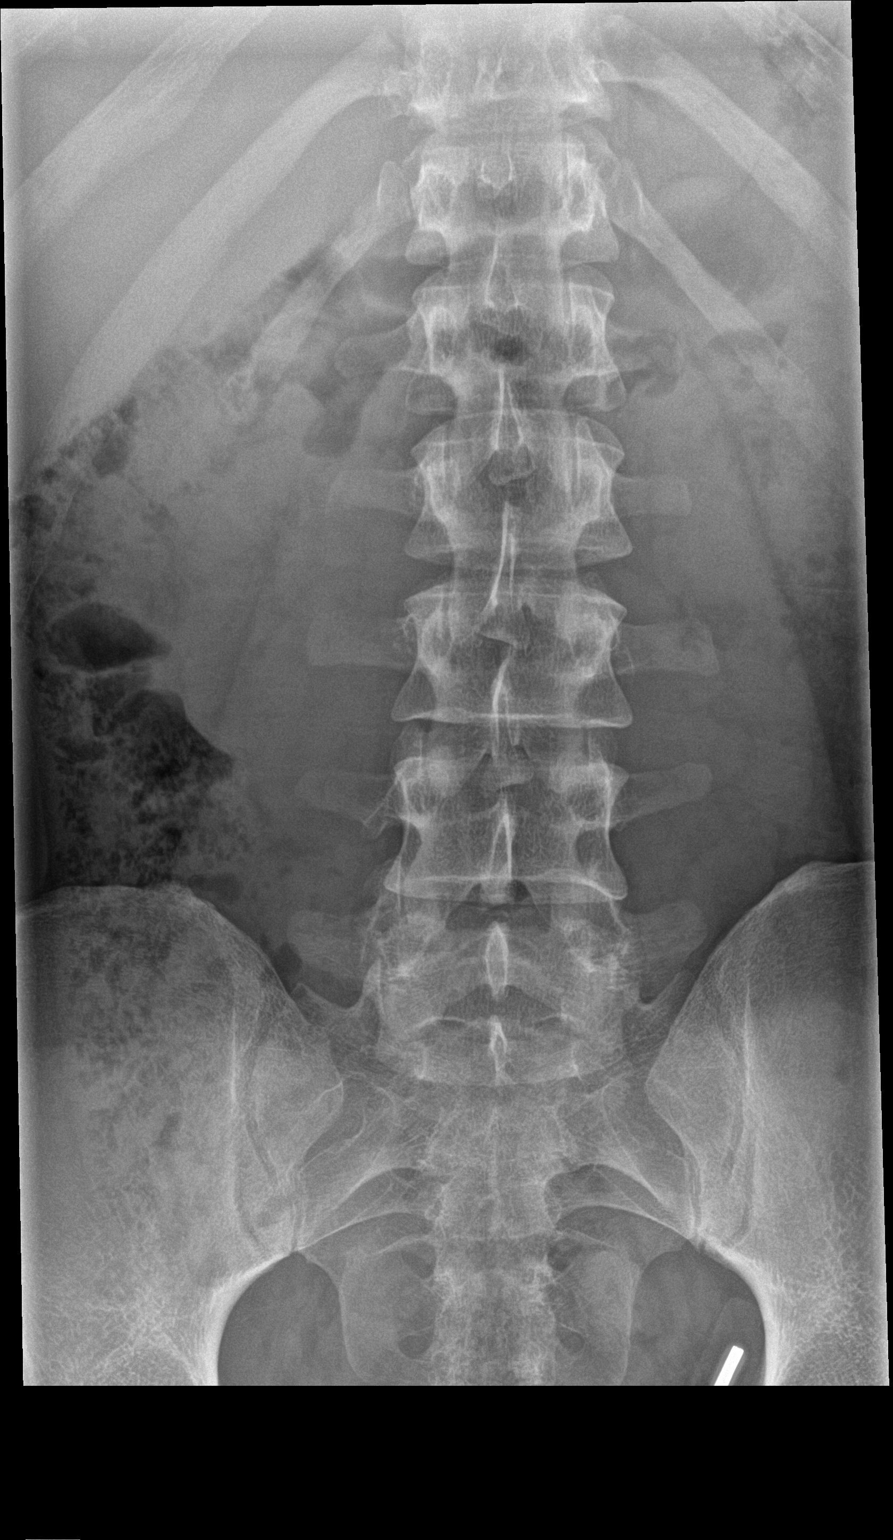

[l-spine lat]
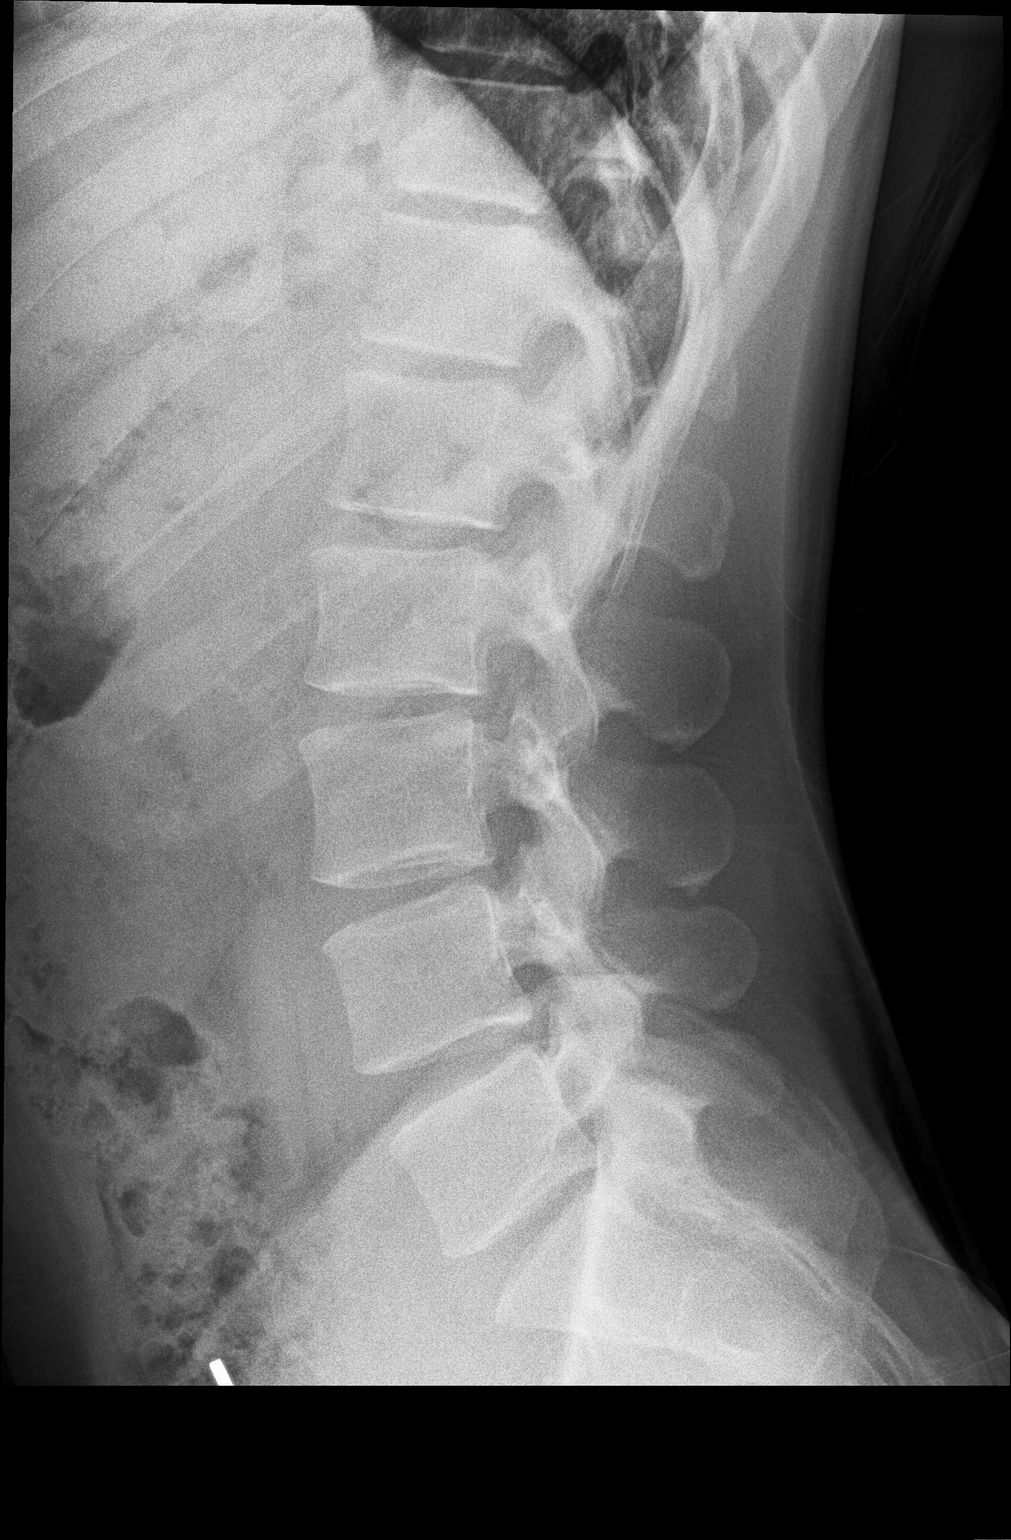

[l-spine l5-s1]
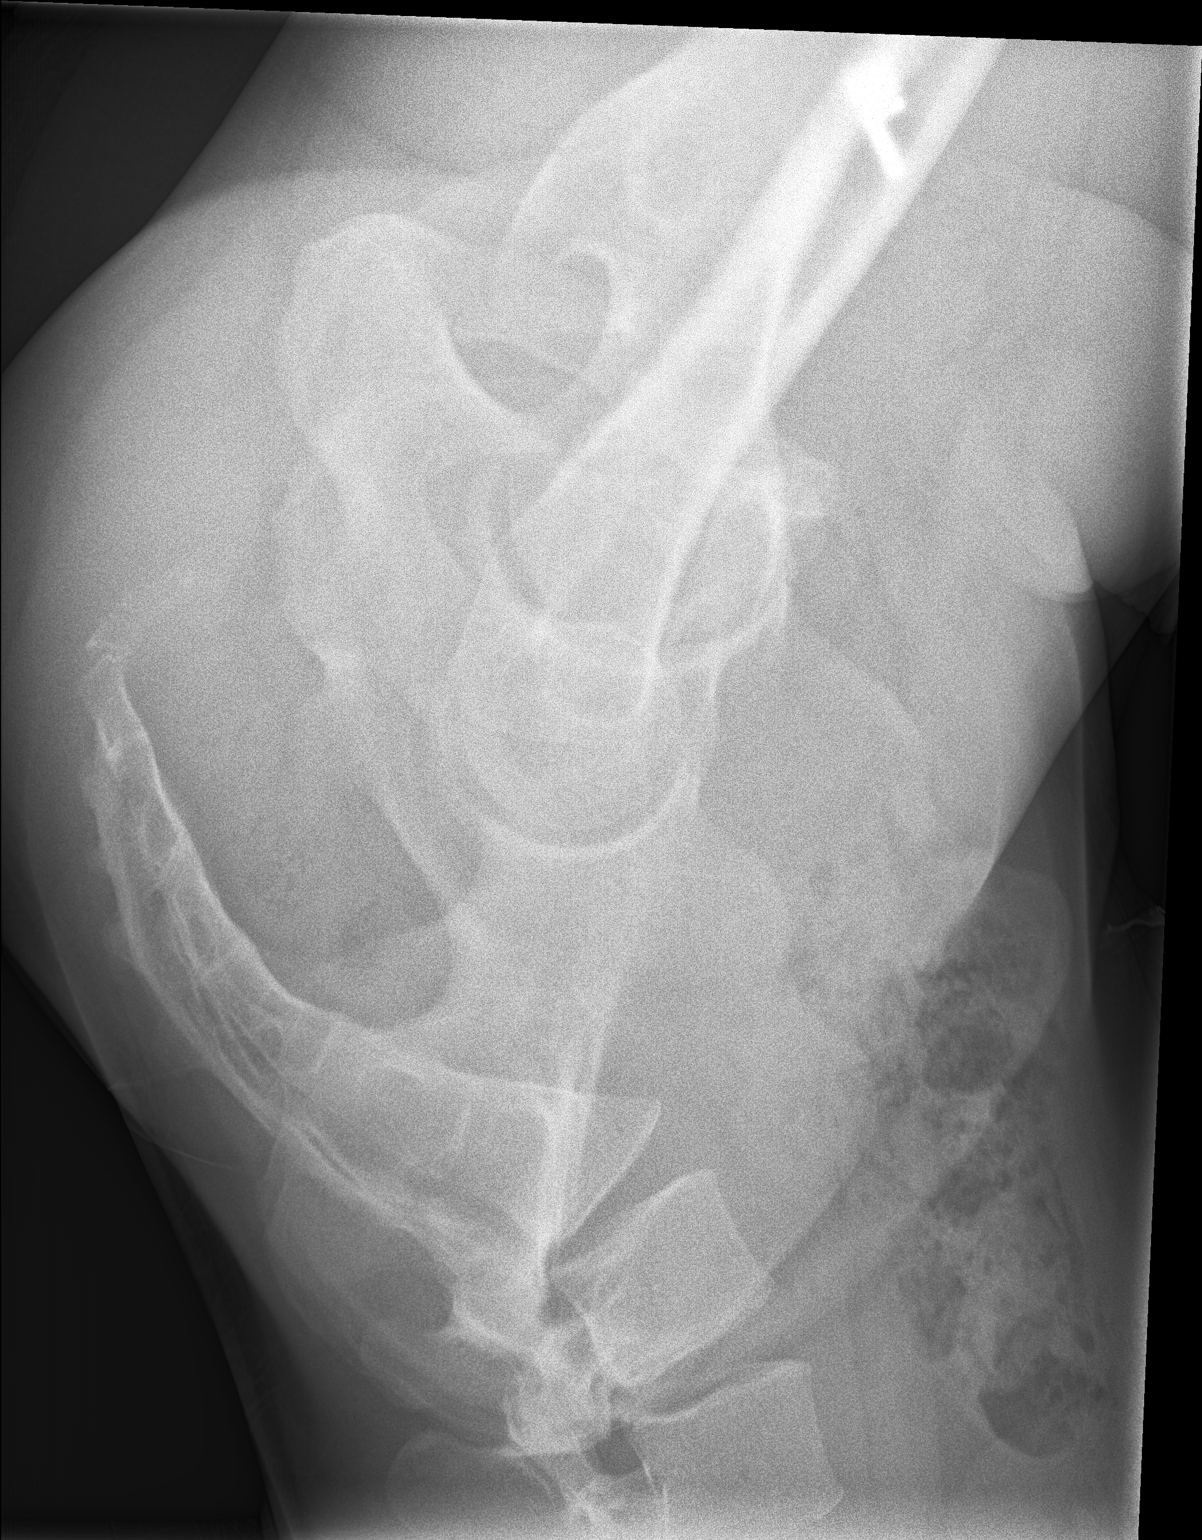

[3 of 3 positions shown; findings below may reference images not displayed]

FINDINGS: There is no evidence of lumbar spine fracture. Alignment is normal.
Intervertebral disc spaces are maintained.
IMPRESSION: Negative.

## 2021-07-29 DIAGNOSIS — M5451 Vertebrogenic low back pain: Secondary | ICD-10-CM | POA: Diagnosis not present

## 2021-08-06 ENCOUNTER — Ambulatory Visit: Payer: BLUE CROSS/BLUE SHIELD | Admitting: Orthopaedic Surgery

## 2021-08-09 ENCOUNTER — Other Ambulatory Visit: Payer: Self-pay | Admitting: Orthopedic Surgery

## 2021-08-09 DIAGNOSIS — M545 Low back pain, unspecified: Secondary | ICD-10-CM

## 2021-08-09 DIAGNOSIS — M5451 Vertebrogenic low back pain: Secondary | ICD-10-CM

## 2021-08-13 ENCOUNTER — Ambulatory Visit: Payer: BLUE CROSS/BLUE SHIELD | Admitting: Family

## 2021-08-24 ENCOUNTER — Other Ambulatory Visit: Payer: BLUE CROSS/BLUE SHIELD

## 2021-08-26 ENCOUNTER — Ambulatory Visit
Admission: RE | Admit: 2021-08-26 | Discharge: 2021-08-26 | Disposition: A | Discharge status: Home / Self Care | Payer: No Typology Code available for payment source | Source: Ambulatory Visit | Attending: Orthopedic Surgery | Admitting: Orthopedic Surgery

## 2021-08-26 ENCOUNTER — Encounter: Payer: Self-pay | Admitting: Registered Nurse

## 2021-08-26 ENCOUNTER — Ambulatory Visit (INDEPENDENT_AMBULATORY_CARE_PROVIDER_SITE_OTHER): Payer: 59 | Admitting: Registered Nurse

## 2021-08-26 ENCOUNTER — Other Ambulatory Visit: Payer: Self-pay

## 2021-08-26 VITALS — BP 117/72 | HR 73 | Temp 98.1°F | Resp 18 | Ht 69.0 in | Wt 171.4 lb

## 2021-08-26 DIAGNOSIS — F419 Anxiety disorder, unspecified: Secondary | ICD-10-CM | POA: Diagnosis not present

## 2021-08-26 DIAGNOSIS — R69 Illness, unspecified: Secondary | ICD-10-CM | POA: Diagnosis not present

## 2021-08-26 DIAGNOSIS — R4689 Other symptoms and signs involving appearance and behavior: Secondary | ICD-10-CM

## 2021-08-26 DIAGNOSIS — M545 Low back pain, unspecified: Secondary | ICD-10-CM

## 2021-08-26 DIAGNOSIS — F988 Other specified behavioral and emotional disorders with onset usually occurring in childhood and adolescence: Secondary | ICD-10-CM | POA: Diagnosis not present

## 2021-08-26 DIAGNOSIS — M5451 Vertebrogenic low back pain: Secondary | ICD-10-CM

## 2021-08-26 MED ORDER — LAMOTRIGINE 25 MG PO TABS: 25.0000 mg | ORAL_TABLET | Freq: Every day | ORAL | 0 refills | Status: DC

## 2021-08-26 NOTE — Patient Instructions (Addendum)
Jared Delacruz -  ? ?Great to meet you  ? ?Call with any concerns ? ?See you in 6 mo ? ?I'll be in touch with MRI results ? ?Thanks, ? ?Rich  ? ? ?If you have lab work done today you will be contacted with your lab results within the next 2 weeks.  If you have not heard from [place] then please contact us. The fastest way to get your results is to register for My Chart. ? ? ?IF you received an x-ray today, you will receive an invoice from [facility]. Please contact [facility] at [phone] with questions or concerns regarding your invoice.  ? ?IF you received labwork today, you will receive an invoice from [place]. Please contact [facility] at [phone] with questions or concerns regarding your invoice.  ? ?Our billing staff will not be able to assist you with questions regarding bills from these companies. ? ?You will be contacted with the lab results as soon as they are available. The fastest way to get your results is to activate your My Chart account. Instructions are located on the last page of this paperwork. If you have not heard from [place] regarding the results in 2 weeks, please contact this office. ?  ? ? ?

## 2021-08-26 NOTE — Progress Notes (Signed)
? ?New Patient Office Visit ? ?Subjective:  ?Patient ID: Jared Delacruz, male    DOB: [birth year]  Age: 31 y.o. MRN: [id number] ? ?CC:  ?Chief Complaint  ?Patient presents with  ? New Patient (Initial Visit)  ?  Patient states he is here to establish care. Patient states he would like to discuss Back problems after MRI and medication management  ? ? ?HPI ?Jared Delacruz presents for visit to est care. ? ?Histories reviewed and updated with patient.  ? ?Back pain ?Wants to discuss back pain - ongoing for some time. 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  ?He did have this repeated this morning - read not available.  ?He has pain worst when sitting long periods.  ?Has tried PT in the past with minimal effect.  ? ?ADHD ?On methylphenidate 18mg  CR po qd ?Good effect, no AE. ?Last refill was given by , PA-C, last PCP. ?Per pdmp last fill 08/04/21 for 30 day supply.  ? ?Past Medical History:  ?Diagnosis Date  ? ADHD   ? Anxiety   ? Depression   ? Substance abuse (HCC)   ? Alcohol, self-medication with mood modulating substances  ? ? ?History reviewed. No pertinent surgical history. ? ?Family History  ?Problem Relation Age of Onset  ? Rheum arthritis Mother   ? Anxiety disorder Mother   ? Cancer Father   ?     salivary  ? ADD / ADHD Maternal Grandmother   ? Depression Maternal Aunt   ? Alcohol abuse Maternal Uncle   ? ? ?Social History  ? ?Socioeconomic History  ? Marital status: Single  ?  Spouse name: Not on file  ? Number of children: 0  ? Years of education: Not on file  ? Highest education level: Not on file  ?Occupational History  ? Not on file  ?Tobacco Use  ? Smoking status: Never  ? Smokeless tobacco: Never  ?Vaping Use  ? Vaping Use: Former  ? Start date: 12/14/2017  ? Quit date: 03/17/2018  ?Substance and Sexual Activity  ? Alcohol use: Not Currently  ?  Alcohol/week: 1.0 standard drink  ?  Types: 1 Cans of beer per week  ?  Comment: social monthly -  socially  ? Drug use: No  ? Sexual activity: Not Currently  ?  Birth control/protection: Abstinence, Condom  ?Other Topics Concern  ? Not on file  ?Social History Narrative  ? PTA in assisted living  ?   ? Owns a large house with brother that they are renovating.  ? ?Social Determinants of Health  ? ?Financial Resource Strain: Not on file  ?Food Insecurity: Not on file  ?Transportation Needs: Not on file  ?Physical Activity: Not on file  ?Stress: Not on file  ?Social Connections: Not on file  ?Intimate Partner Violence: Not on file  ? ? ?ROS ?Review of Systems  ?Constitutional: Negative.   ?HENT: Negative.    ?Eyes: Negative.   ?Respiratory: Negative.    ?Cardiovascular: Negative.   ?Gastrointestinal: Negative.   ?Genitourinary: Negative.   ?Musculoskeletal: Negative.   ?Skin: Negative.   ?Neurological: Negative.   ?Psychiatric/Behavioral:  Positive for behavioral problems and decreased concentration. The patient is nervous/anxious.   ?All other systems reviewed and are negative. ? ?Objective:  ? ?Today's Vitals: BP 117/72   Pulse 73   Temp 98.1 ?F (36.7 ?C) (Temporal)   Resp 18   Ht 5\' 9"  (1.753 m)  Wt 171 lb 6.4 oz (77.7 kg)   SpO2 100%   BMI 25.31 kg/m?  ? ?Physical Exam ?Vitals and nursing note reviewed.  ?Constitutional:   ?   Appearance: Normal appearance.  ?Cardiovascular:  ?   Rate and Rhythm: Normal rate and regular rhythm.  ?   Pulses: Normal pulses.  ?   Heart sounds: Normal heart sounds. No murmur heard. ?  No friction rub. No gallop.  ?Pulmonary:  ?   Effort: Pulmonary effort is normal. No respiratory distress.  ?   Breath sounds: Normal breath sounds. No stridor. No wheezing, rhonchi or rales.  ?Neurological:  ?   General: No focal deficit present.  ?   Mental Status: He is alert. Mental status is at baseline.  ?Psychiatric:     ?   Mood and Affect: Mood normal.     ?   Behavior: Behavior normal.     ?   Thought Content: Thought content normal.     ?   Judgment: Judgment normal.   ? ? ?Assessment & Plan:  ? ?Problem List Items Addressed This Visit   ? ?  ? Other  ? ADD (attention deficit disorder) - Primary  ? Relevant Medications  ? lamoTRIgine (LAMICTAL) 25 MG tablet  ? Other Relevant Orders  ? Ambulatory referral to Psychiatry  ? Anxiety  ? Relevant Medications  ? lamoTRIgine (LAMICTAL) 25 MG tablet  ? Other Relevant Orders  ? Ambulatory referral to Psychiatry  ? ?Other Visit Diagnoses   ? ? Compulsive behavior      ? Relevant Medications  ? lamoTRIgine (LAMICTAL) 25 MG tablet  ? Other Relevant Orders  ? Ambulatory referral to Psychiatry  ? ?  ? ? ?Outpatient Encounter Medications as of 08/26/2021  ?Medication Sig  ? lamoTRIgine (LAMICTAL) 25 MG tablet Take 1 tablet (25 mg total) by mouth daily.  ? methylphenidate 18 MG PO CR tablet Take 18 mg by mouth daily.  ? [DISCONTINUED] amphetamine-dextroamphetamine (ADDERALL XR) 15 MG 24 hr capsule Take 1 capsule by mouth every morning.  ? [DISCONTINUED] meloxicam (MOBIC) 7.5 MG tablet Take 1-2 tablets (7.5-15 mg total) by mouth daily.  ? ?No facility-administered encounter medications on file as of 08/26/2021.  ? ? ?Follow-up: Return in about 6 months (around 02/26/2022) for ADHD med check.  ? ?PLAN ?Continue methylpenidate. Not yet due for refill ?Refer to psychiatry ?Start lamictal 25mg  po qd. Med check in 6 w via MyChart ?Patient encouraged to call clinic with any questions, comments, or concerns. ? ? , NP ? ?

## 2021-08-29 ENCOUNTER — Encounter: Payer: Self-pay | Admitting: Registered Nurse

## 2021-09-24 ENCOUNTER — Encounter: Payer: Self-pay | Admitting: Behavioral Health

## 2021-09-24 ENCOUNTER — Ambulatory Visit (INDEPENDENT_AMBULATORY_CARE_PROVIDER_SITE_OTHER): Payer: 59 | Admitting: Behavioral Health

## 2021-09-24 VITALS — BP 131/82 | HR 70 | Ht 69.0 in | Wt 172.0 lb

## 2021-09-24 DIAGNOSIS — F39 Unspecified mood [affective] disorder: Secondary | ICD-10-CM

## 2021-09-24 DIAGNOSIS — F331 Major depressive disorder, recurrent, moderate: Secondary | ICD-10-CM | POA: Diagnosis not present

## 2021-09-24 DIAGNOSIS — F411 Generalized anxiety disorder: Secondary | ICD-10-CM

## 2021-09-24 DIAGNOSIS — R69 Illness, unspecified: Secondary | ICD-10-CM | POA: Diagnosis not present

## 2021-09-24 MED ORDER — LAMOTRIGINE 100 MG PO TABS: 50.0000 mg | ORAL_TABLET | Freq: Every day | ORAL | 1 refills | Status: DC

## 2021-09-24 NOTE — Progress Notes (Signed)
[facility] MD/PA/NP Initial Note ? ?09/24/2021 5:03 PM ?Jared Delacruz  ?MRN:  [id number] ? ?Chief Complaint:  ? ?HPI:  ? ?31 year old male presents to this office for initial visit and to establish care. He says that, "I have gone my entire life knowing something was wrong but I really believe I am Bipolar". He presents with easy distractibility and unconscious fidgeting such as massaging forehead or rubbing his forearms. He appears restless. He says that he struggled with depression at young age around 31 or 22  but feels he outgrew some of that. He moved Out [place] in his early 20's to participate with [employer] obligations. He began to consume larger amounts of ETOH and started to use hallucinogenic drugs such as mushrooms, acid, and high level THC cannabis.  He wonders if these substances may have triggered some of his mood fluctuations. He quit drinking about 1 month except for an occasional one beer. Says it makes him feel weird. He does not use illicit substances anymore. 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  He says that, "I sometimes feel that I can interpret what message people are trying to convey in conversation better than what they understand. He says he understands the true meaning of their expression". He endorses every single criterion on MDQ.  He says that his moods have been roller coaster like with highs and lows. Says that his lows have lessened a little bit but used to be "dark holes".  Says that he has been very hesitant about RX medication and has refrained from getting help for years.  His PCP started him on low dose Lamictal 25 mg  and says he believes the medication is helping some. He does not like the idea of SSRI"s but is trying to be more open. He would like to participate with [facility] to assist in medication planning. Says his depression today is 5/10 and anxiety is 4/10. He denies current mania, but is very talkative today with some trouble staying on  topic. He recently stopped taking Ritalin because he says that it made him more depressed. No psychosis, denies auditory or visual hallucinations. No SI/HI. ? ?Former psychiatric medications: ?Wellbutrin ?Adderall ?Ritalin ? ? ?Visit Diagnosis:  ?  ICD-10-CM   ?1. Generalized anxiety disorder  F41.1 lamoTRIgine (LAMICTAL) 100 MG tablet  ?  ?2. Unspecified mood (affective) disorder (HCC)  F39 lamoTRIgine (LAMICTAL) 100 MG tablet  ?  ?3. Major depressive disorder, recurrent episode, moderate (HCC)  F33.1 lamoTRIgine (LAMICTAL) 100 MG tablet  ?  ? ? ?Past Psychiatric History: Anxiety, MDD, ADHD ? ?Past Medical History:  ?Past Medical History:  ?Diagnosis Date  ? ADHD   ? Anxiety   ? Depression   ? Substance abuse (HCC)   ? Alcohol, self-medication with mood modulating substances  ? No past surgical history on file. ? ?Family Psychiatric History: see chart ? ?Family History:  ?Family History  ?Problem Relation Age of Onset  ? Rheum arthritis Mother   ? Anxiety disorder Mother   ? Cancer Father   ?     salivary  ? ADD / ADHD Maternal Grandmother   ? Depression Maternal Aunt   ? Alcohol abuse Maternal Uncle   ? ? ?Social History:  ?Social History  ? ?Socioeconomic History  ? Marital status: Single  ?  Spouse name: Not on file  ? Number of children: 0  ? Years of education: Not on file  ? Highest education level: Not on file  ?  Occupational History  ? Not on file  ?Tobacco Use  ? Smoking status: Never  ? Smokeless tobacco: Never  ?Vaping Use  ? Vaping Use: Former  ? Start date: 12/14/2017  ? Quit date: 03/17/2018  ?Substance and Sexual Activity  ? Alcohol use: Not Currently  ?  Alcohol/week: 1.0 standard drink  ?  Types: 1 Cans of beer per week  ?  Comment: social monthly - socially  ? Drug use: No  ? Sexual activity: Not Currently  ?  Birth control/protection: Abstinence, Condom  ?Other Topics Concern  ? Not on file  ?Social History Narrative  ? PTA in assisted living  ?   ? Owns a large house with brother that they are  renovating.  ? ?Social Determinants of Health  ? ?Financial Resource Strain: Not on file  ?Food Insecurity: Not on file  ?Transportation Needs: Not on file  ?Physical Activity: Not on file  ?Stress: Not on file  ?Social Connections: Not on file  ? ? ?Allergies:  ?Allergies  ?Allergen Reactions  ? Shellfish Allergy   ? ? ?Metabolic Disorder Labs: ?No results found for: HGBA1C, MPG ?No results found for: PROLACTIN ?No results found for: CHOL, TRIG, HDL, CHOLHDL, VLDL, LDLCALC ?No results found for: TSH ? ?Therapeutic Level Labs: ?No results found for: LITHIUM ?No results found for: VALPROATE ?No components found for:  CBMZ ? ?Current Medications: ?Current Outpatient Medications  ?Medication Sig Dispense Refill  ? lamoTRIgine (LAMICTAL) 100 MG tablet Take 0.5 tablets (50 mg total) by mouth daily. 30 tablet 1  ? lamoTRIgine (LAMICTAL) 25 MG tablet Take 1 tablet (25 mg total) by mouth daily. 90 tablet 0  ? methylphenidate 18 MG PO CR tablet Take 18 mg by mouth daily.    ? ?No current facility-administered medications for this visit.  ? ? ?Medication Side Effects: none ? ?Orders placed this visit:  No orders of the defined types were placed in this encounter. ? ? ?Psychiatric Specialty Exam: ? ?Review of Systems  ?Constitutional: Negative.   ?Musculoskeletal:  Positive for back pain.  ?Allergic/Immunologic: Negative.   ?Neurological: Negative.   ?Psychiatric/Behavioral:  Positive for dysphoric mood. The patient is nervous/anxious.    ?Blood pressure 131/82, pulse 70, height 5\' 9"  (1.753 m), weight 172 lb (78 kg).Body mass index is 25.4 kg/m?.  ?General Appearance: Casual, Neat, and Well Groomed  ?Eye Contact:  Fair  ?Speech:  Pressured and Talkative  ?Volume:  Normal  ?Mood:  Anxious  ?Affect:  Non-Congruent and Anxious  ?Thought Process:  Coherent  ?Orientation:  Full (Time, Place, and Person)  ?Thought Content: Logical, Rumination, and Tangential   ?Suicidal Thoughts:  No  ?Homicidal Thoughts:  No  ?Memory:  WNL   ?Judgement:  Good  ?Insight:  Good  ?Psychomotor Activity:  Normal  ?Concentration:  Concentration: Good  ?Recall:  Good  ?Fund of Knowledge: Good  ?Language: Good  ?Assets:  Desire for Improvement  ?ADL's:  Intact  ?Cognition: WNL  ?Prognosis:  Good  ? ?Screenings:  ?GAD-7   ? ?Flowsheet Row Office Visit from 05/26/2018 in [facility]  ?Total GAD-7 Score 12  ? ?  ? ?PHQ2-9   ? ?Flowsheet Row Office Visit from 08/26/2021 in [facility] Office Visit from 05/26/2018 in [facility] at [facility] Office Visit from 10/08/2017 in [facility] Visit from 07/[facility] Visit [facility] 07/[facility]/2015 [facility]  ?PHQ-2 Total Score 5 1 2  0 0  ?PHQ-9 Total Score 16 3 14  -- --  ? ?  ? ? ?Receiving Psychotherapy: No  ? ?Treatment Plan/Recommendations:  ? ?Greater than 50% of  60 min face to face time with patient was spent on counseling and coordination of care. We discussed his long hx of depression and mood swings starting in his young adolescent years.  Talked about his hx of illicit psychodelic drug use as well as ETOH. Discussed his reluctance and barriers to seeking help and fear of psychiatric medications as well as prior treatment plans. ?We agreed today to: ?[facility] testing  ?To increase Lamictal to 100 mg daily ?To follow up in 4 weeks to reassess and review [facility] Results ?Provided emergency contact information ?Monitor for any sign of rash. Please taking Lamictal and contact office immediately rash develops. Recommend seeking urgent medical attention if rash is severe and/or spreading quickly.  ?Reviewed PDMP ? ? ? , NP ? ?         ?

## 2021-09-25 DIAGNOSIS — M5451 Vertebrogenic low back pain: Secondary | ICD-10-CM | POA: Diagnosis not present

## 2021-10-02 ENCOUNTER — Encounter: Payer: Self-pay | Admitting: Behavioral Health

## 2021-10-09 DIAGNOSIS — R69 Illness, unspecified: Secondary | ICD-10-CM | POA: Diagnosis not present

## 2021-10-14 ENCOUNTER — Encounter: Payer: Self-pay | Admitting: Registered Nurse

## 2021-10-17 DIAGNOSIS — R69 Illness, unspecified: Secondary | ICD-10-CM | POA: Diagnosis not present

## 2021-10-17 DIAGNOSIS — F411 Generalized anxiety disorder: Secondary | ICD-10-CM | POA: Diagnosis not present

## 2021-10-18 ENCOUNTER — Other Ambulatory Visit: Payer: Self-pay | Admitting: Registered Nurse

## 2021-10-18 DIAGNOSIS — F419 Anxiety disorder, unspecified: Secondary | ICD-10-CM

## 2021-10-18 DIAGNOSIS — F988 Other specified behavioral and emotional disorders with onset usually occurring in childhood and adolescence: Secondary | ICD-10-CM

## 2021-10-18 DIAGNOSIS — R4689 Other symptoms and signs involving appearance and behavior: Secondary | ICD-10-CM

## 2021-10-22 DIAGNOSIS — M5451 Vertebrogenic low back pain: Secondary | ICD-10-CM | POA: Diagnosis not present

## 2021-10-23 ENCOUNTER — Ambulatory Visit: Payer: 59 | Admitting: Behavioral Health

## 2021-10-24 DIAGNOSIS — F902 Attention-deficit hyperactivity disorder, combined type: Secondary | ICD-10-CM | POA: Diagnosis not present

## 2021-10-24 DIAGNOSIS — R69 Illness, unspecified: Secondary | ICD-10-CM | POA: Diagnosis not present

## 2021-10-24 DIAGNOSIS — F319 Bipolar disorder, unspecified: Secondary | ICD-10-CM | POA: Diagnosis not present

## 2021-10-24 NOTE — Telephone Encounter (Signed)
Patient just wanted to give you an update on everything Please advise message below

## 2021-10-30 DIAGNOSIS — M5451 Vertebrogenic low back pain: Secondary | ICD-10-CM | POA: Diagnosis not present

## 2021-10-31 DIAGNOSIS — R69 Illness, unspecified: Secondary | ICD-10-CM | POA: Diagnosis not present

## 2021-11-06 DIAGNOSIS — M5451 Vertebrogenic low back pain: Secondary | ICD-10-CM | POA: Diagnosis not present

## 2021-11-13 DIAGNOSIS — M5451 Vertebrogenic low back pain: Secondary | ICD-10-CM | POA: Diagnosis not present

## 2021-11-14 DIAGNOSIS — F411 Generalized anxiety disorder: Secondary | ICD-10-CM | POA: Diagnosis not present

## 2021-11-14 DIAGNOSIS — F902 Attention-deficit hyperactivity disorder, combined type: Secondary | ICD-10-CM | POA: Diagnosis not present

## 2021-11-14 DIAGNOSIS — R69 Illness, unspecified: Secondary | ICD-10-CM | POA: Diagnosis not present

## 2021-11-20 DIAGNOSIS — M5451 Vertebrogenic low back pain: Secondary | ICD-10-CM | POA: Diagnosis not present

## 2021-11-28 DIAGNOSIS — R69 Illness, unspecified: Secondary | ICD-10-CM | POA: Diagnosis not present

## 2021-12-12 DIAGNOSIS — R69 Illness, unspecified: Secondary | ICD-10-CM | POA: Diagnosis not present

## 2022-01-02 DIAGNOSIS — F902 Attention-deficit hyperactivity disorder, combined type: Secondary | ICD-10-CM | POA: Diagnosis not present

## 2022-01-02 DIAGNOSIS — F411 Generalized anxiety disorder: Secondary | ICD-10-CM | POA: Diagnosis not present

## 2022-01-02 DIAGNOSIS — R69 Illness, unspecified: Secondary | ICD-10-CM | POA: Diagnosis not present

## 2022-01-16 DIAGNOSIS — R69 Illness, unspecified: Secondary | ICD-10-CM | POA: Diagnosis not present

## 2022-01-23 DIAGNOSIS — R69 Illness, unspecified: Secondary | ICD-10-CM | POA: Diagnosis not present

## 2022-01-30 ENCOUNTER — Ambulatory Visit (INDEPENDENT_AMBULATORY_CARE_PROVIDER_SITE_OTHER): Payer: 59 | Admitting: Physician Assistant

## 2022-01-30 ENCOUNTER — Encounter: Payer: Self-pay | Admitting: Physician Assistant

## 2022-01-30 VITALS — BP 120/77 | HR 56 | Temp 98.0°F | Ht 69.0 in | Wt 172.8 lb

## 2022-01-30 DIAGNOSIS — F319 Bipolar disorder, unspecified: Secondary | ICD-10-CM | POA: Diagnosis not present

## 2022-01-30 DIAGNOSIS — R69 Illness, unspecified: Secondary | ICD-10-CM | POA: Diagnosis not present

## 2022-01-30 DIAGNOSIS — Z23 Encounter for immunization: Secondary | ICD-10-CM

## 2022-01-30 NOTE — Patient Instructions (Signed)
Welcome to [facility]! It was a pleasure meeting you today.  Flu shot today. Call for Lamictal refill if needed prior to next appointment.   As discussed, Please schedule a 3 month follow up visit today.  PLEASE NOTE:  If you had any LAB tests please let us know if you have not heard back within a few days. You may see your results on MyChart before we have a chance to review them but we will give you a call once they are reviewed by [place]. If we ordered any REFERRALS today, please let us know if you have not heard from their office within the next two weeks. Let us know through MyChart if you are needing REFILLS, or have your pharmacy send [place] the request. You can also use MyChart to communicate with me or any office staff.  Please try these tips to maintain a healthy lifestyle:  Eat most of your calories during the day when you are active. Eliminate processed foods including packaged sweets (pies, cakes, cookies), reduce intake of potatoes, white bread, white pasta, and white rice. Look for whole grain options, oat flour or almond flour.  Each meal should contain half fruits/vegetables, one quarter protein, and one quarter carbs (no bigger than a computer mouse).  Cut down on sweet beverages. This includes juice, soda, and sweet tea. Also watch fruit intake, though this is a healthier sweet option, it still contains natural sugar! Limit to 3 servings daily.  Drink at least 1 glass of water with each meal and aim for at least 8 glasses (64 ounces) per day.  Exercise at least 150 minutes every week to the best of your ability.    Take Care,  Marlaina Coburn, PA-C

## 2022-01-30 NOTE — Progress Notes (Signed)
Subjective:    Patient ID: Jared Delacruz, male    DOB: 1990-12-18, 31 y.o.   MRN: [id number]  Chief Complaint  Patient presents with   Transitions Of Care    Former [facility] pt in [place]; pt mainly needs medication management and uses online services for once medication in particular (Lamictal).     HPI 31 y.o. patient presents today for new patient establishment with me.  Patient was previously established with Janeece Agee, NP.  Current Care Team: [facility] - Counseling visits with NP - She is prescribing Lamictal 150 mg daily   Concerns: Bipolar 1 - Would like PCP to manage medication. Used to be on Ritalin, but then would crash at the end of the day, and didn't care for taking it. Now on Lamictal and feels like this is the right fit. Started on this February 2023. Hasn't had labs checked. Relationships are much better. Social anxiety is much better. Still working through some things in the past. No current SI or HI.    Past Medical History:  Diagnosis Date   ADHD    Anxiety    Bipolar 1 disorder (HCC)    Depression    Substance abuse (HCC)    Alcohol, self-medication with mood modulating substances    History reviewed. No pertinent surgical history.  Family History  Problem Relation Age of Onset   Rheum arthritis Mother    Anxiety disorder Mother    Cancer Father        salivary   ADD / ADHD Maternal Grandmother    Depression Maternal Aunt    Alcohol abuse Maternal Uncle     Social History   Tobacco Use   Smoking status: Never   Smokeless tobacco: Never  Vaping Use   Vaping Use: Former   Start date: 12/14/2017   Quit date: 03/17/2018  Substance Use Topics   Alcohol use: Not Currently    Alcohol/week: 1.0 standard drink of alcohol    Types: 1 Cans of beer per week    Comment: social monthly - socially   Drug use: No     Allergies  Allergen Reactions   Shellfish Allergy     Review of Systems NEGATIVE UNLESS OTHERWISE INDICATED  IN HPI      Objective:     BP 120/77 (BP Location: Left Arm)   Pulse (!) 56   Temp 98 F (36.7 C) (Temporal)   Ht 5\' 9"  (1.753 m)   Wt 172 lb 12.8 oz (78.4 kg)   SpO2 97%   BMI 25.52 kg/m   Wt Readings from Last 3 Encounters:  01/30/22 172 lb 12.8 oz (78.4 kg)  08/26/21 171 lb 6.4 oz (77.7 kg)  05/26/18 170 lb 6.4 oz (77.3 kg)    BP Readings from Last 3 Encounters:  01/30/22 120/77  08/26/21 117/72  05/26/18 122/73     Physical Exam Constitutional:      Appearance: Normal appearance.  Cardiovascular:     Rate and Rhythm: Normal rate and regular rhythm.     Pulses: Normal pulses.     Heart sounds: Normal heart sounds.  Pulmonary:     Effort: Pulmonary effort is normal.     Breath sounds: Normal breath sounds.  Neurological:     General: No focal deficit present.     Mental Status: He is alert and oriented to person, place, and time.  Psychiatric:        Mood and Affect: Mood normal.  Behavior: Behavior normal.        Assessment & Plan:   Problem List Items Addressed This Visit       Other   Bipolar 1 disorder (HCC) - Primary    Currently stable, doing well on Lamictal 150 mg daily. Informed him I'm ok to take over prescribing for him, will call for refills. Need to check baseline labs - will do this at next appointment. Cont counseling through [employer]. Call sooner if any concerns.   If you develop suicidal thoughts, please tell someone and immediately proceed to our local 24/7 crisis center, [facility].[address], [place], [place] [phone].           Return in about 3 months (around 05/02/2022) for fasting labs, CPE .  This note was prepared with assistance of Conservation officer, historic buildings. Occasional wrong-word or sound-a-like substitutions may have occurred due to the inherent limitations of voice recognition software.  Time Spent: 30  minutes of total time was spent on the date of the encounter performing the following actions: chart review prior to seeing the patient, obtaining history, performing a medically necessary exam, counseling on the treatment plan, placing orders, and documenting in our EHR.       Lisa Milian M [employer], PA-C

## 2022-01-30 NOTE — Assessment & Plan Note (Signed)
Currently stable, doing well on Lamictal 150 mg daily. Informed him I'm ok to take over prescribing for him, will call for refills. Need to check baseline labs - will do this at next appointment. Cont counseling through [employer]. Call sooner if any concerns.   If you develop suicidal thoughts, please tell someone and immediately proceed to our local 24/7 crisis center, [facility] the [facility].[address] [place], [place] [phone].

## 2022-02-06 DIAGNOSIS — R69 Illness, unspecified: Secondary | ICD-10-CM | POA: Diagnosis not present

## 2022-02-13 DIAGNOSIS — R69 Illness, unspecified: Secondary | ICD-10-CM | POA: Diagnosis not present

## 2022-02-13 DIAGNOSIS — F411 Generalized anxiety disorder: Secondary | ICD-10-CM | POA: Diagnosis not present

## 2022-02-13 DIAGNOSIS — F902 Attention-deficit hyperactivity disorder, combined type: Secondary | ICD-10-CM | POA: Diagnosis not present

## 2022-02-26 ENCOUNTER — Ambulatory Visit: Payer: 59 | Admitting: Registered Nurse

## 2022-02-27 DIAGNOSIS — R69 Illness, unspecified: Secondary | ICD-10-CM | POA: Diagnosis not present

## 2022-04-10 DIAGNOSIS — R69 Illness, unspecified: Secondary | ICD-10-CM | POA: Diagnosis not present

## 2022-04-10 DIAGNOSIS — F411 Generalized anxiety disorder: Secondary | ICD-10-CM | POA: Diagnosis not present

## 2022-04-10 DIAGNOSIS — F902 Attention-deficit hyperactivity disorder, combined type: Secondary | ICD-10-CM | POA: Diagnosis not present

## 2022-05-05 ENCOUNTER — Encounter: Payer: 59 | Admitting: Physician Assistant

## 2022-05-12 ENCOUNTER — Encounter: Payer: Self-pay | Admitting: Physician Assistant

## 2022-05-12 NOTE — Telephone Encounter (Signed)
Please see patient message as Lorain Childes before upcoming appt for physical and advise if anything needed sooner than appt.

## 2022-05-21 ENCOUNTER — Encounter: Payer: 59 | Admitting: Physician Assistant

## 2022-05-22 ENCOUNTER — Encounter: Payer: 59 | Admitting: Physician Assistant

## 2022-06-10 ENCOUNTER — Encounter: Payer: 59 | Admitting: Physician Assistant

## 2022-07-02 ENCOUNTER — Encounter: Payer: 59 | Admitting: Physician Assistant

## 2022-07-03 DIAGNOSIS — F902 Attention-deficit hyperactivity disorder, combined type: Secondary | ICD-10-CM | POA: Diagnosis not present

## 2022-07-03 DIAGNOSIS — F411 Generalized anxiety disorder: Secondary | ICD-10-CM | POA: Diagnosis not present

## 2022-07-03 DIAGNOSIS — R69 Illness, unspecified: Secondary | ICD-10-CM | POA: Diagnosis not present

## 2022-07-08 ENCOUNTER — Ambulatory Visit (INDEPENDENT_AMBULATORY_CARE_PROVIDER_SITE_OTHER): Payer: 59 | Admitting: Physician Assistant

## 2022-07-08 ENCOUNTER — Encounter: Payer: Self-pay | Admitting: Physician Assistant

## 2022-07-08 VITALS — BP 134/74 | HR 59 | Temp 97.1°F | Ht 69.0 in | Wt 174.0 lb

## 2022-07-08 DIAGNOSIS — Z0000 Encounter for general adult medical examination without abnormal findings: Secondary | ICD-10-CM | POA: Diagnosis not present

## 2022-07-08 DIAGNOSIS — F319 Bipolar disorder, unspecified: Secondary | ICD-10-CM | POA: Diagnosis not present

## 2022-07-08 DIAGNOSIS — R69 Illness, unspecified: Secondary | ICD-10-CM | POA: Diagnosis not present

## 2022-07-08 HISTORY — DX: Encounter for general adult medical examination without abnormal findings: Z00.00

## 2022-07-08 LAB — LIPID PANEL
Cholesterol: 260 mg/dL — ABNORMAL HIGH (ref 0–200)
HDL: 65.4 mg/dL (ref >39.00)
LDL Cholesterol: 171 mg/dL — ABNORMAL HIGH (ref 0–99)
NonHDL: 194.27
Total CHOL/HDL Ratio: 4
Triglycerides: 115 mg/dL (ref 0.0–149.0)
VLDL: 23 mg/dL (ref 0.0–40.0)

## 2022-07-08 LAB — CBC WITH DIFFERENTIAL/PLATELET
Basophils Absolute: 0 10*3/uL (ref 0.0–0.1)
Basophils Relative: 0.4 % (ref 0.0–3.0)
Eosinophils Absolute: 0 10*3/uL (ref 0.0–0.7)
Eosinophils Relative: 0.9 % (ref 0.0–5.0)
HCT: 40.9 % (ref 39.0–52.0)
Hemoglobin: 14.1 g/dL (ref 13.0–17.0)
Lymphocytes Relative: 26.5 % (ref 12.0–46.0)
Lymphs Abs: 0.8 10*3/uL (ref 0.7–4.0)
MCHC: 34.3 g/dL (ref 30.0–36.0)
MCV: 87.4 fl (ref 78.0–100.0)
Monocytes Absolute: 0.2 10*3/uL (ref 0.1–1.0)
Monocytes Relative: 8.2 % (ref 3.0–12.0)
Neutro Abs: 1.8 10*3/uL (ref 1.4–7.7)
Neutrophils Relative %: 64 % (ref 43.0–77.0)
Platelets: 192 10*3/uL (ref 150.0–400.0)
RBC: 4.69 Mil/uL (ref 4.22–5.81)
RDW: 13.1 % (ref 11.5–15.5)
WBC: 2.9 10*3/uL — ABNORMAL LOW (ref 4.0–10.5)

## 2022-07-08 LAB — COMPREHENSIVE METABOLIC PANEL
ALT: 18 U/L (ref 0–53)
AST: 21 U/L (ref 0–37)
Albumin: 5 g/dL (ref 3.5–5.2)
Alkaline Phosphatase: 57 U/L (ref 39–117)
BUN: 13 mg/dL (ref 6–23)
CO2: 30 mEq/L (ref 19–32)
Calcium: 9.9 mg/dL (ref 8.4–10.5)
Chloride: 103 mEq/L (ref 96–112)
Creatinine, Ser: 0.95 mg/dL (ref 0.40–1.50)
GFR: 107.38 mL/min (ref >60.00)
Glucose, Bld: 102 mg/dL — ABNORMAL HIGH (ref 70–99)
Potassium: 4 mEq/L (ref 3.5–5.1)
Sodium: 141 mEq/L (ref 135–145)
Total Bilirubin: 0.5 mg/dL (ref 0.2–1.2)
Total Protein: 7.5 g/dL (ref 6.0–8.3)

## 2022-07-08 LAB — TSH: TSH: 1.72 u[IU]/mL (ref 0.35–5.50)

## 2022-07-08 NOTE — Progress Notes (Signed)
Subjective:    Patient ID: Jared Delacruz, male    DOB: [birth year], 32 y.o.   MRN: [id number]  Chief Complaint  Patient presents with   Annual Exam    Pt in office for annual CPE w/ fasting labs; pt having some irritation in right eye and more blurry than left eye; also noticing having to cut out sugar, feels fatigued and feels bad and makes him feel dehydrated; also having right shoulder pain to discuss.     HPI Patient is in today for annual exam.   Health maintenance: Lifestyle/ exercise: Enjoys exercise, limited sometimes due to disc issues, walks often  Nutrition: Overall good and incorporating more vegetables, less sugar right now  Mental health: follows with psychiatric NP online Caffeine: Coffee, Redbull rarely  Sleep: Doing well  Substance use: none  Sexual activity: not active currently  Immunizations: UTD    Past Medical History:  Diagnosis Date   ADHD    Anxiety    Bipolar 1 disorder ([place])    Depression    Encounter for annual physical exam 07/08/2022   Substance abuse ([place])    Alcohol, self-medication with mood modulating substances    History reviewed. No pertinent surgical history.  Family History  Problem Relation Age of Onset   Rheum arthritis Mother    Anxiety disorder Mother    Cancer Father        salivary   ADD / ADHD Maternal Grandmother    Depression Maternal Aunt    Alcohol abuse Maternal Uncle     Social History   Tobacco Use   Smoking status: Never   Smokeless tobacco: Never  Vaping Use   Vaping Use: Former   Start date: 12/14/2017   Quit date: 03/17/2018  Substance Use Topics   Alcohol use: Not Currently    Alcohol/week: 1.0 standard drink of alcohol    Types: 1 Cans of beer per week    Comment: social monthly - socially   Drug use: No     Allergies  Allergen Reactions   Banana Rash   Shellfish Allergy     Review of Systems NEGATIVE UNLESS OTHERWISE INDICATED IN HPI      Objective:     BP (!) 144/86 (BP Location:  Left Arm)   Pulse (!) 59   Temp (!) 97.1 F (36.2 C) (Temporal)   Ht 5\' 9"  (1.753 m)   Wt 174 lb (78.9 kg)   SpO2 99%   BMI 25.70 kg/m   Wt Readings from Last 3 Encounters:  07/08/22 174 lb (78.9 kg)  01/30/22 172 lb 12.8 oz (78.4 kg)  08/26/21 171 lb 6.4 oz (77.7 kg)    BP Readings from Last 3 Encounters:  07/08/22 (!) 144/86  01/30/22 120/77  08/26/21 117/72     Physical Exam Vitals and nursing note reviewed.  Constitutional:      General: He is not in acute distress.    Appearance: Normal appearance. He is not toxic-appearing.  HENT:     Head: Normocephalic and atraumatic.     Right Ear: Tympanic membrane, ear canal and external ear normal.     Left Ear: Tympanic membrane, ear canal and external ear normal.     Nose: Nose normal.     Mouth/Throat:     Mouth: Mucous membranes are moist.     Pharynx: Oropharynx is clear.  Eyes:     Extraocular Movements: Extraocular movements intact.     Conjunctiva/sclera: Conjunctivae normal.     Pupils:  Pupils are equal, round, and reactive to light.  Cardiovascular:     Rate and Rhythm: Normal rate and regular rhythm.     Pulses: Normal pulses.     Heart sounds: Normal heart sounds.  Pulmonary:     Effort: Pulmonary effort is normal.     Breath sounds: Normal breath sounds.  Abdominal:     General: Abdomen is flat. Bowel sounds are normal.     Palpations: Abdomen is soft.     Tenderness: There is no abdominal tenderness.  Musculoskeletal:        General: Normal range of motion.     Cervical back: Normal range of motion and neck supple.  Skin:    General: Skin is warm and dry.     Findings: No lesion or rash.  Neurological:     General: No focal deficit present.     Mental Status: He is alert and oriented to person, place, and time.  Psychiatric:        Mood and Affect: Mood normal.        Behavior: Behavior normal.        Assessment & Plan:  Encounter for annual physical exam Assessment & Plan: Age-appropriate  screening and counseling performed today. Will check labs and call with results. Preventive measures discussed and printed in AVS for patient.   Patient Counseling: [x]   Nutrition: Stressed importance of moderation in sodium/caffeine intake, saturated fat and cholesterol, caloric balance, sufficient intake of fresh fruits, vegetables, and fiber.  [x]   Stressed the importance of regular exercise.   [x]   Substance Abuse: Discussed cessation/primary prevention of tobacco, alcohol, or other drug use; driving or other dangerous activities under the influence; availability of treatment for abuse.   []   Injury prevention: Discussed safety belts, safety helmets, smoke detector, smoking near bedding or upholstery.   [x]   Sexuality: Discussed sexually transmitted diseases, partner selection, use of condoms, avoidance of unintended pregnancy  and contraceptive alternatives.   [x]   Dental health: Discussed importance of regular tooth brushing, flossing, and dental visits.  [x]   Health maintenance and immunizations reviewed. Please refer to Health maintenance section.      Orders: -     CBC with Differential/Platelet -     Comprehensive metabolic panel -     Lipid panel -     TSH  Bipolar 1 disorder (HCC) Assessment & Plan: Lamictal 150 mg daily at this time. Following with [facility] care.   Orders: -     CBC with Differential/Platelet -     Comprehensive metabolic panel -     Lipid panel -     TSH        Return in about 1 year (around 07/09/2023) for Well visit with fasting labs .  This note was prepared with assistance of Systems analyst. Occasional wrong-word or sound-a-like substitutions may have occurred due to the inherent limitations of voice recognition software.     Madix Blowe M Alila Sotero, PA-C

## 2022-07-08 NOTE — Assessment & Plan Note (Signed)
Age-appropriate screening and counseling performed today. Will check labs and call with results. Preventive measures discussed and printed in AVS for patient.  Patient Counseling: [x]  Nutrition: Stressed importance of moderation in sodium/caffeine intake, saturated fat and cholesterol, caloric balance, sufficient intake of fresh fruits, vegetables, and fiber.  [x]  Stressed the importance of regular exercise.   [x]  Substance Abuse: Discussed cessation/primary prevention of tobacco, alcohol, or other drug use; driving or other dangerous activities under the influence; availability of treatment for abuse.   []  Injury prevention: Discussed safety belts, safety helmets, smoke detector, smoking near bedding or upholstery.   [x]  Sexuality: Discussed sexually transmitted diseases, partner selection, use of condoms, avoidance of unintended pregnancy  and contraceptive alternatives.   [x]  Dental health: Discussed importance of regular tooth brushing, flossing, and dental visits.  [x]  Health maintenance and immunizations reviewed. Please refer to Health maintenance section.     

## 2022-07-08 NOTE — Assessment & Plan Note (Signed)
Lamictal 150 mg daily at this time. Following with [facility] care.

## 2022-07-13 ENCOUNTER — Telehealth: Payer: 59

## 2022-07-27 ENCOUNTER — Emergency Department (HOSPITAL_BASED_OUTPATIENT_CLINIC_OR_DEPARTMENT_OTHER)
Admission: EM | Admit: 2022-07-27 | Discharge: 2022-07-28 | Disposition: A | Discharge status: Home / Self Care | Payer: 59 | Attending: Emergency Medicine | Admitting: Emergency Medicine

## 2022-07-27 ENCOUNTER — Other Ambulatory Visit: Payer: Self-pay

## 2022-07-27 ENCOUNTER — Encounter (HOSPITAL_BASED_OUTPATIENT_CLINIC_OR_DEPARTMENT_OTHER): Payer: Self-pay

## 2022-07-27 DIAGNOSIS — T63791A Toxic effect of contact with other venomous plant, accidental (unintentional), initial encounter: Secondary | ICD-10-CM | POA: Insufficient documentation

## 2022-07-27 DIAGNOSIS — Z9189 Other specified personal risk factors, not elsewhere classified: Secondary | ICD-10-CM

## 2022-07-27 DIAGNOSIS — R000 Tachycardia, unspecified: Secondary | ICD-10-CM | POA: Diagnosis not present

## 2022-07-27 LAB — CBC WITH DIFFERENTIAL/PLATELET
Abs Immature Granulocytes: 0.01 10*3/uL (ref 0.00–0.07)
Basophils Absolute: 0 10*3/uL (ref 0.0–0.1)
Basophils Relative: 1 %
Eosinophils Absolute: 0.1 10*3/uL (ref 0.0–0.5)
Eosinophils Relative: 1 %
HCT: 39.6 % (ref 39.0–52.0)
Hemoglobin: 13.9 g/dL (ref 13.0–17.0)
Immature Granulocytes: 0 %
Lymphocytes Relative: 28 %
Lymphs Abs: 1.5 10*3/uL (ref 0.7–4.0)
MCH: 30.2 pg (ref 26.0–34.0)
MCHC: 35.1 g/dL (ref 30.0–36.0)
MCV: 86.1 fL (ref 80.0–100.0)
Monocytes Absolute: 0.5 10*3/uL (ref 0.1–1.0)
Monocytes Relative: 9 %
Neutro Abs: 3.3 10*3/uL (ref 1.7–7.7)
Neutrophils Relative %: 61 %
Platelets: 179 10*3/uL (ref 150–400)
RBC: 4.6 MIL/uL (ref 4.22–5.81)
RDW: 12.2 % (ref 11.5–15.5)
WBC: 5.3 10*3/uL (ref 4.0–10.5)
nRBC: 0 % (ref 0.0–0.2)

## 2022-07-27 LAB — COMPREHENSIVE METABOLIC PANEL
ALT: 17 U/L (ref 0–44)
AST: 22 U/L (ref 15–41)
Albumin: 5.1 g/dL — ABNORMAL HIGH (ref 3.5–5.0)
Alkaline Phosphatase: 51 U/L (ref 38–126)
Anion gap: 9 (ref 5–15)
BUN: 18 mg/dL (ref 6–20)
CO2: 30 mmol/L (ref 22–32)
Calcium: 10.2 mg/dL (ref 8.9–10.3)
Chloride: 103 mmol/L (ref 98–111)
Creatinine, Ser: 1.21 mg/dL (ref 0.61–1.24)
GFR, Estimated: >60 mL/min (ref >60)
Glucose, Bld: 129 mg/dL — ABNORMAL HIGH (ref 70–99)
Potassium: 3.9 mmol/L (ref 3.5–5.1)
Sodium: 142 mmol/L (ref 135–145)
Total Bilirubin: 0.4 mg/dL (ref 0.3–1.2)
Total Protein: 7.5 g/dL (ref 6.5–8.1)

## 2022-07-27 LAB — URINALYSIS, ROUTINE W REFLEX MICROSCOPIC
Bilirubin Urine: NEGATIVE
Glucose, UA: NEGATIVE mg/dL
Hgb urine dipstick: NEGATIVE
Ketones, ur: NEGATIVE mg/dL
Leukocytes,Ua: NEGATIVE
Nitrite: NEGATIVE
Protein, ur: NEGATIVE mg/dL
Specific Gravity, Urine: 1.009 (ref 1.005–1.030)
pH: 5.5 (ref 5.0–8.0)

## 2022-07-27 LAB — CK: Total CK: 189 U/L (ref 49–397)

## 2022-07-27 LAB — MAGNESIUM: Magnesium: 2.1 mg/dL (ref 1.7–2.4)

## 2022-07-27 MED ORDER — CHARCOAL ACTIVATED PO LIQD
79.0000 g | Freq: Once | ORAL | Status: AC
Start: 2022-07-27 — End: 2022-07-27
  Administered 2022-07-27: 79 g via ORAL
  Filled 2022-07-27: qty 480

## 2022-07-27 MED ORDER — LACTATED RINGERS IV BOLUS
2000.0000 mL | Freq: Once | INTRAVENOUS | Status: AC
  Administered 2022-07-27: 2000 mL via INTRAVENOUS

## 2022-07-27 NOTE — ED Notes (Signed)
Poison control called back & advised that obs is based on symptoms; EDP notified of same

## 2022-07-27 NOTE — ED Triage Notes (Signed)
Pt states he ingested poison hemlock, thinking it was wild parsnip "a couple hours ago." States he's having "some SHOB/ weird breathing sensation, throat feels funny & mouth feels dry."

## 2022-07-27 NOTE — ED Notes (Addendum)
Poison control advises: Expect tachycardia 1g/kg activated charcoal Treat for nausea  Can see CNS depression, respiratory depression/ paralysis Treat w benzos, fluids for rhabdo, can have rebound bradycardia Frequent neuro checks Seizure precautions Rpt EKG q4h  6-8hr obs, will update if need be

## 2022-07-28 DIAGNOSIS — H1045 Other chronic allergic conjunctivitis: Secondary | ICD-10-CM | POA: Diagnosis not present

## 2022-07-28 NOTE — Discharge Instructions (Signed)
You were seen here in the emergency department today for evaluation after possibly ingesting some Hemlock.  After multiple conversations with poison control, it is safe for discharge home.  Your vital signs have improved, your lab work is unremarkable, your EKG is normal sinus rhythm.  Please be cautious when eating different plants.  If you have any new or worsening symptoms, please return to the nurse for department for evaluation.    Contact a health care provider if: Your symptoms get worse. You have signs of dehydration, such as: Thirst. A dry mouth. Not urinating, or urinating only a small amount of very dark urine, within 6-8 hours. A headache. Weakness. Get help right away if: You have trouble breathing. You become dizzy. You are confused or disoriented. You lose consciousness or have a seizure. These symptoms may be an emergency. Get help right away. Call 911. Do not wait to see if the symptoms will go away. Do not drive yourself to the hospital.

## 2022-07-28 NOTE — ED Provider Notes (Signed)
[facility] Provider Note   CSN: [id number] Arrival date & time: 07/27/22  [id number]     History Chief Complaint  Patient presents with   Ingestion    Jared Delacruz is a 32 y.o. male with h/o bipolar presents to the ER for evaluation of possible ingestion of hemlock 2 hours pta. The patient reports that he was eating out of the garden when he thought he took a bite of a parsnip, but it had a bad taste so he didn't eat anymore. He reports he ate dinner about an hour later and reports that he felt his throat "go numb". He reports that he felt like something bad was going to happen. He compares this to when he eats shellfish that he is allergic to. Denies any chest pain, SOB, nausea, vomiting, abdominal pain, headache, or seizure like activity. He reports that he did feel some twitching in his lower legs earlier, but was able to drive here. He was concerned with this feeling so looked up on the Internet what he could have eaten and discovered it could have been hemlock and came to the ER. On Lamotrigine.    Ingestion Pertinent negatives include no chest pain, no abdominal pain, no headaches and no shortness of breath.       Home Medications Prior to Admission medications   Medication Sig Start Date End Date Taking? Authorizing Provider  lamoTRIgine (LAMICTAL) 100 MG tablet Take 0.5 tablets (50 mg total) by mouth daily. Patient taking differently: Take 150 mg by mouth daily. 09/24/21   Elwanda Brooklyn, NP  lamoTRIgine (LAMICTAL) 25 MG tablet Take 1 tablet (25 mg total) by mouth daily. 08/26/21   Maximiano Coss, NP      Allergies    Banana and Shellfish allergy    Review of Systems   Review of Systems  Constitutional:  Negative for chills and fever.  HENT:  Positive for sore throat.   Respiratory:  Negative for shortness of breath.   Cardiovascular:  Negative for chest pain.  Gastrointestinal:  Negative for abdominal pain, constipation, diarrhea,  nausea and vomiting.  Genitourinary:  Negative for dysuria and hematuria.  Neurological:  Negative for seizures, syncope, light-headedness and headaches.    Physical Exam Updated Vital Signs BP (!) 131/97   Pulse 74   Temp 98.2 F (36.8 C)   Resp 14   SpO2 100%  Physical Exam Vitals and nursing note reviewed.  Constitutional:      General: He is not in acute distress.    Appearance: Normal appearance. He is not ill-appearing, toxic-appearing or diaphoretic.  HENT:     Head: Normocephalic and atraumatic.     Mouth/Throat:     Mouth: Mucous membranes are moist.     Pharynx: No oropharyngeal exudate or posterior oropharyngeal erythema.  Eyes:     General: No scleral icterus. Cardiovascular:     Rate and Rhythm: Regular rhythm. Tachycardia present.  Pulmonary:     Effort: Pulmonary effort is normal. No respiratory distress.     Breath sounds: Normal breath sounds.  Abdominal:     General: Abdomen is flat. Bowel sounds are normal.     Palpations: Abdomen is soft.     Tenderness: There is no abdominal tenderness. There is no guarding or rebound.  Musculoskeletal:        General: No deformity.     Cervical back: Normal range of motion. No rigidity.  Skin:    General: Skin is warm and  dry.  Neurological:     General: No focal deficit present.     Mental Status: He is alert. Mental status is at baseline.     Cranial Nerves: No cranial nerve deficit.     Sensory: No sensory deficit.     Motor: No weakness.     ED Results / Procedures / Treatments   Labs (all labs ordered are listed, but only abnormal results are displayed) Labs Reviewed  COMPREHENSIVE METABOLIC PANEL - Abnormal; Notable for the following components:      Result Value   Glucose, Bld 129 (*)    Albumin 5.1 (*)    All other components within normal limits  URINALYSIS, ROUTINE W REFLEX MICROSCOPIC - Abnormal; Notable for the following components:   Color, Urine COLORLESS (*)    All other components within  normal limits  CBC WITH DIFFERENTIAL/PLATELET  MAGNESIUM  CK    EKG EKG Interpretation  Date/Time:  Sunday July 27 2022 18:46:27 EST Ventricular Rate:  136 PR Interval:  126 QRS Duration: 94 QT Interval:  388 QTC Calculation: 583 R Axis:   78 Text Interpretation: Sinus tachycardia Left ventricular hypertrophy with repolarization abnormality Cannot rule out Inferior infarct , age undetermined Abnormal ECG No previous ECGs available Confirmed by Veryl Speak [phone]) on 07/28/2022 12:36:05 AM  Radiology No results found.  Procedures Procedures   Medications Ordered in ED Medications  charcoal activated (NO SORBITOL) (ACTIDOSE-AQUA) suspension 79 g (79 g Oral Given 07/27/22 1920)  lactated ringers bolus 2,000 mL (0 mLs Intravenous Stopped 07/27/22 2028)    ED Course/ Medical Decision Making/ A&P Clinical Course as of 07/28/22 1915  Mon Jul 28, 2022  0046 Spoke with Lennette Bihari from poison Introl and gave updated vitals, new EKG, and labs.  Given that patient has not had any symptoms in hours, he is safe for discharge home. No further recommendations from [facility]  [RR]    Clinical Course User Index [RR] Sherrell Puller, PA-C                            Medical Decision Making Amount and/or Complexity of Data Reviewed Labs: ordered.  Risk OTC drugs.   Picture of what the patient ate:    32 y/o M presents to the ER for evaluation after possibly ingesting hemlock. Differential diagnosis includes but is not limited to possible ingestion versus anxiety. Vital signs shows initial tachycardia, but improved a short time later, otherwise unremarkable. Physical exam as noted above.  Nursing initially called Poison Control. Recommendations are as listed :   Poison control advises: Expect tachycardia 1g/kg activated charcoal Treat for nausea   Can see CNS depression, respiratory depression/ paralysis Treat w benzos, fluids for rhabdo, can have rebound  bradycardia Frequent neuro checks Seizure precautions Rpt EKG q4h   6-8hr obs, will update if need be  2L LR bolus ordered as well as the activated charcoal. Q1H neuro checks ordered as well.   I independently reviewed and interpreted the patient's labs.  CBC without leukocytosis or anemia.  CMP shows mildly elevated glucose 139.  Mildly elevated albumin of 5.1 otherwise no electrolyte or LFT abnormality.  Normal magnesium.  CK within normal limits.  Urinalysis shows colorless urine otherwise negative.  Initial EKG shows Sinus tachycardia, Left ventricular hypertrophy with repolarization abnormality. Prolonged QTC possibly could be rate related. Repeat EKG shows NSR. QTC 441.  Neurological exams have been unremarkable. The patient reports that he is feeling  much better.   I reached out to poison control again to discuss labs, repeat EKG, and vital signs. I spoke with Lennette Bihari who reports that he is safe for discharge home. No further recommendations. I discussed this with the patient. We discussed symptoms to look out for. We discussed strict return precautions and red flag symptoms. The patient verbalized his understanding and agrees to the plan. The patient is stable and being discharge home in good condition.   Final Clinical Impression(s) / ED Diagnoses Final diagnoses:  Potential for poisoning by plant    Rx / DC Orders ED Discharge Orders     None         Sherrell Puller, PA-C 07/28/22 [place], [place], DO 07/28/22 2301

## 2022-10-09 DIAGNOSIS — F319 Bipolar disorder, unspecified: Secondary | ICD-10-CM | POA: Diagnosis not present

## 2022-10-09 DIAGNOSIS — F902 Attention-deficit hyperactivity disorder, combined type: Secondary | ICD-10-CM | POA: Diagnosis not present

## 2022-10-09 DIAGNOSIS — F411 Generalized anxiety disorder: Secondary | ICD-10-CM | POA: Diagnosis not present

## 2022-11-24 DIAGNOSIS — H43811 Vitreous degeneration, right eye: Secondary | ICD-10-CM | POA: Diagnosis not present

## 2022-11-24 DIAGNOSIS — H02889 Meibomian gland dysfunction of unspecified eye, unspecified eyelid: Secondary | ICD-10-CM | POA: Diagnosis not present

## 2023-01-08 DIAGNOSIS — F319 Bipolar disorder, unspecified: Secondary | ICD-10-CM | POA: Diagnosis not present

## 2023-01-08 DIAGNOSIS — F411 Generalized anxiety disorder: Secondary | ICD-10-CM | POA: Diagnosis not present

## 2023-01-08 DIAGNOSIS — Z111 Encounter for screening for respiratory tuberculosis: Secondary | ICD-10-CM | POA: Diagnosis not present

## 2023-01-08 DIAGNOSIS — F902 Attention-deficit hyperactivity disorder, combined type: Secondary | ICD-10-CM | POA: Diagnosis not present

## 2023-01-11 DIAGNOSIS — Z111 Encounter for screening for respiratory tuberculosis: Secondary | ICD-10-CM | POA: Diagnosis not present

## 2023-03-10 ENCOUNTER — Telehealth: Payer: Self-pay | Admitting: Physician Assistant

## 2023-03-10 NOTE — Telephone Encounter (Signed)
Pt would like to transfer from Jared Delacruz to Dr Jon Billings, he prefers a male doctor because of his issues. Please advise.

## 2023-03-16 NOTE — Telephone Encounter (Signed)
Patient has been scheduled for TOC on 11/5 @ 10:20 am w/Dr. Jon Billings.

## 2023-03-17 ENCOUNTER — Ambulatory Visit (INDEPENDENT_AMBULATORY_CARE_PROVIDER_SITE_OTHER): Payer: 59 | Admitting: Urology

## 2023-03-17 ENCOUNTER — Encounter: Payer: Self-pay | Admitting: Urology

## 2023-03-17 VITALS — BP 130/77 | HR 68 | Ht 70.0 in | Wt 168.0 lb

## 2023-03-17 DIAGNOSIS — R361 Hematospermia: Secondary | ICD-10-CM | POA: Diagnosis not present

## 2023-03-17 DIAGNOSIS — N3943 Post-void dribbling: Secondary | ICD-10-CM | POA: Diagnosis not present

## 2023-03-17 DIAGNOSIS — N5089 Other specified disorders of the male genital organs: Secondary | ICD-10-CM | POA: Diagnosis not present

## 2023-03-17 LAB — URINALYSIS, ROUTINE W REFLEX MICROSCOPIC
Bilirubin, UA: NEGATIVE
Glucose, UA: NEGATIVE
Ketones, UA: NEGATIVE
Leukocytes,UA: NEGATIVE
Nitrite, UA: NEGATIVE
Protein,UA: NEGATIVE
RBC, UA: NEGATIVE
Specific Gravity, UA: 1.015 (ref 1.005–1.030)
Urobilinogen, Ur: 0.2 mg/dL (ref 0.2–1.0)
pH, UA: 6.5 (ref 5.0–7.5)

## 2023-03-17 LAB — BLADDER SCAN AMB NON-IMAGING

## 2023-03-17 MED ORDER — SULFAMETHOXAZOLE-TRIMETHOPRIM 800-160 MG PO TABS: 1.0000 | ORAL_TABLET | Freq: Two times a day (BID) | ORAL | 0 refills | Status: AC

## 2023-03-17 NOTE — Progress Notes (Signed)
Assessment: 1. Hematospermia   2. Genital lesion, male   3. Post-void dribbling     Plan: Recommend course of antibiotics for hematospermia I discussed cryotherapy for genital lesions. He is very concerned about his urinary symptoms.  I advised him that the postvoid dribbling is not an uncommon symptom and is rarely due to an anatomic problem in his age group.  I advised him that further evaluation would likely require cystoscopy. Return to office in 1 month  Chief Complaint:  Chief Complaint  Patient presents with   blood in semen    History of Present Illness:  Jared Delacruz is a 32 y.o. male who is seen for evaluation of hematospermia, genital lesions, and urinary symptoms. 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  He has had occasional episodes.  His symptoms have cleared following antibiotic therapy.  He has also been taking saw palmetto.  His last episode occurred approximately 1 week ago.  He did have some discomfort with ejaculation.  He also reports some occasional discomfort in the rectal area.  He reports several dark lesions on the proximal penile shaft.  These have been present for 18-24 months.  No change in size.  He reports he has not been sexually active in over 2 years.  No history of STDs.  He reports some intermittent urinary symptoms, primarily postvoid dribbling.  No dysuria or gross hematuria. IPSS = 6.    Past Medical History:  Past Medical History:  Diagnosis Date   ADHD    Anxiety    Bipolar 1 disorder (HCC)    Depression    Encounter for annual physical exam 07/08/2022   Substance abuse (HCC)    Alcohol, self-medication with mood modulating substances    Past Surgical History:  No past surgical history on file.  Allergies:  Allergies  Allergen Reactions   Banana Rash   Shellfish Allergy     Family History:  Family History  Problem Relation Age of Onset   Rheum arthritis Mother    Anxiety disorder  Mother    Cancer Father        salivary   ADD / ADHD Maternal Grandmother    Depression Maternal Aunt    Alcohol abuse Maternal Uncle     Social History:  Social History   Tobacco Use   Smoking status: Never   Smokeless tobacco: Never  Vaping Use   Vaping status: Former   Start date: 12/14/2017   Quit date: 03/17/2018  Substance Use Topics   Alcohol use: Not Currently    Alcohol/week: 1.0 standard drink of alcohol    Types: 1 Cans of beer per week    Comment: social monthly - socially   Drug use: No    Review of symptoms:  Constitutional:  Negative for unexplained weight loss, night sweats, fever, chills ENT:  Negative for nose bleeds, sinus pain, painful swallowing CV:  Negative for chest pain, shortness of breath, exercise intolerance, palpitations, loss of consciousness Resp:  Negative for cough, wheezing, shortness of breath GI:  Negative for nausea, vomiting, diarrhea, bloody stools GU:  Positives noted in HPI; otherwise negative for gross hematuria, dysuria, urinary incontinence Neuro:  Negative for seizures, poor balance, limb weakness, slurred speech Psych:  Negative for lack of energy, depression, anxiety Endocrine:  Negative for polydipsia, polyuria, symptoms of hypoglycemia (dizziness, hunger, sweating) Hematologic:  Negative for anemia, purpura, petechia, prolonged or excessive bleeding, use of anticoagulants  Allergic:  Negative for difficulty breathing or choking  as a result of exposure to anything; no shellfish allergy; no allergic response (rash/itch) to materials, foods  Physical exam: BP 130/77   Pulse 68   Ht 5\' 10"  (1.778 m)   Wt 168 lb (76.2 kg)   BMI 24.11 kg/m  GENERAL APPEARANCE:  Well appearing, well developed, well nourished, NAD HEENT: Atraumatic, Normocephalic, oropharynx clear. NECK: Supple without lymphadenopathy or thyromegaly. LUNGS: Clear to auscultation bilaterally. HEART: Regular Rate and Rhythm without murmurs, gallops, or  rubs. ABDOMEN: Soft, non-tender, No Masses. EXTREMITIES: Moves all extremities well.  Without clubbing, cyanosis, or edema. NEUROLOGIC:  Alert and oriented x 3, normal gait, CN II-XII grossly intact.  MENTAL STATUS:  Appropriate. BACK:  Non-tender to palpation.  No CVAT SKIN:  Warm, dry and intact.   GU: Penis:  circumcised, 3 brown lesions on dorsal proximal shaft Meatus: Normal Scrotum: normal, no masses Testis: normal without masses bilateral Epididymis: normal Prostate: 20 g, NT, no nodules Rectum: Normal tone,  no masses or tenderness   Results: U/A: negative  PVR = 185 ml

## 2023-04-09 DIAGNOSIS — F411 Generalized anxiety disorder: Secondary | ICD-10-CM | POA: Diagnosis not present

## 2023-04-09 DIAGNOSIS — F902 Attention-deficit hyperactivity disorder, combined type: Secondary | ICD-10-CM | POA: Diagnosis not present

## 2023-04-09 DIAGNOSIS — F319 Bipolar disorder, unspecified: Secondary | ICD-10-CM | POA: Diagnosis not present

## 2023-04-14 ENCOUNTER — Other Ambulatory Visit (HOSPITAL_COMMUNITY)
Admission: RE | Admit: 2023-04-14 | Discharge: 2023-04-14 | Disposition: A | Discharge status: Home / Self Care | Payer: 59 | Source: Ambulatory Visit | Attending: Internal Medicine | Admitting: Internal Medicine

## 2023-04-14 ENCOUNTER — Other Ambulatory Visit (HOSPITAL_BASED_OUTPATIENT_CLINIC_OR_DEPARTMENT_OTHER): Payer: Self-pay

## 2023-04-14 ENCOUNTER — Ambulatory Visit (INDEPENDENT_AMBULATORY_CARE_PROVIDER_SITE_OTHER): Payer: 59 | Admitting: Internal Medicine

## 2023-04-14 ENCOUNTER — Encounter: Payer: Self-pay | Admitting: Internal Medicine

## 2023-04-14 VITALS — BP 117/64 | HR 88 | Temp 97.7°F | Ht 70.0 in | Wt 167.4 lb

## 2023-04-14 DIAGNOSIS — Z87438 Personal history of other diseases of male genital organs: Secondary | ICD-10-CM

## 2023-04-14 DIAGNOSIS — F331 Major depressive disorder, recurrent, moderate: Secondary | ICD-10-CM

## 2023-04-14 DIAGNOSIS — Z79899 Other long term (current) drug therapy: Secondary | ICD-10-CM

## 2023-04-14 DIAGNOSIS — Z119 Encounter for screening for infectious and parasitic diseases, unspecified: Secondary | ICD-10-CM | POA: Diagnosis not present

## 2023-04-14 DIAGNOSIS — F319 Bipolar disorder, unspecified: Secondary | ICD-10-CM

## 2023-04-14 DIAGNOSIS — Z23 Encounter for immunization: Secondary | ICD-10-CM | POA: Diagnosis not present

## 2023-04-14 DIAGNOSIS — N342 Other urethritis: Secondary | ICD-10-CM | POA: Diagnosis not present

## 2023-04-14 DIAGNOSIS — F908 Attention-deficit hyperactivity disorder, other type: Secondary | ICD-10-CM

## 2023-04-14 DIAGNOSIS — Z113 Encounter for screening for infections with a predominantly sexual mode of transmission: Secondary | ICD-10-CM | POA: Diagnosis not present

## 2023-04-14 LAB — URINALYSIS, ROUTINE W REFLEX MICROSCOPIC
Bilirubin Urine: NEGATIVE
Hgb urine dipstick: NEGATIVE
Ketones, ur: NEGATIVE
Leukocytes,Ua: NEGATIVE
Nitrite: NEGATIVE
RBC / HPF: NONE SEEN (ref 0–?)
Specific Gravity, Urine: 1.01 (ref 1.000–1.030)
Total Protein, Urine: NEGATIVE
Urine Glucose: NEGATIVE
Urobilinogen, UA: 0.2 (ref 0.0–1.0)
WBC, UA: NONE SEEN (ref 0–?)
pH: 7.5 (ref 5.0–8.0)

## 2023-04-14 MED ORDER — FINASTERIDE-MINOXIDIL 0.1-5 % EX SOLN
2.0000 mL | Freq: Every day | CUTANEOUS | 3 refills | Status: DC
  Filled 2023-04-14: qty 180, fill #0

## 2023-04-14 NOTE — Progress Notes (Unsigned)
[facility]: [phone]   -- Medical Office Visit --  Patient:  Jared Delacruz      Age: 32 y.o.       Sex:  male  Date:   04/14/2023 Today's Healthcare Provider: Lula Olszewski, MD  =============================================================================================     Assessment & Plan Urethritis He presents with symptoms indicative of urethritis or epididymitis, with a history of prostatitis but no evident signs of external genital herpes. We will order a full STI panel and urinalysis. Pending test results, we will consider Valtrex if HSV-2 is positive and a Rocephin injection if a gonococcal infection is suspected. Screening examination for infectious disease  Screen for sexually transmitted diseases  Need for influenza vaccination  History of prostatitis He exhibits recurrent symptoms of prostatitis, which may be bacterial. We will consider long-term antibiotic treatment if symptoms persist or if urinalysis indicates a bacterial infection. High risk medication use  Other specified attention deficit hyperactivity disorder (ADHD) He is currently trialing Adderall for ADHD symptoms. We will continue Adderall as prescribed and consider transferring ADHD management to this practice if he is stable and comfortable with the change. Moderate episode of recurrent major depressive disorder (HCC)  Need for prophylactic vaccination against human papillomavirus  Bipolar 1 disorder (HCC) He has a history of bipolar disorder, currently managed with Lamictal. We will continue Lamictal as prescribed and consider transferring psychiatric care to this practice if he is stable and comfortable with the change.  General Health Maintenance   We administered the first dose of the HPV vaccine today, with follow-up doses planned at 1 month and 6 months. A follow-up appointment is scheduled in 1 month for the second HPV vaccine dose, or in 3 months for a routine  check-up, depending on his decision regarding the transfer of psychiatric care.      Diagnoses and all orders for this visit: Screening examination for infectious disease -     HIV antibody (with reflex) -     Hepatitis C Antibody Screen for sexually transmitted diseases -     RPR -     Cytology (oral, anal, urethral) ancillary only -     HSV(herpes simplex vrs) 1+2 ab-IgG -     Urine cytology ancillary only([facility]); Future Need for influenza vaccination -     RPR -     Flu vaccine trivalent PF, 6mos and older(Flulaval,Afluria,Fluarix,Fluzone) History of prostatitis -     Urinalysis, Routine w reflex microscopic Urethritis -     Finasteride-Minoxidil 0.1-5 % SOLN; Apply 2 mLs topically daily. High risk medication use Other specified attention deficit hyperactivity disorder (ADHD) Moderate episode of recurrent major depressive disorder (HCC) Need for prophylactic vaccination against human papillomavirus -     HPV 9-valent vaccine,Recombinat  Recommended follow-up: No follow-ups on file. Future Appointments  Date Time Provider Department Center  04/22/2023  2:30 PM Sondra Come, MD [facility] None  05/14/2023  9:00 AM Lula Olszewski, MD [facility]  Patient Care Team: Lula Olszewski, MD as PCP - General (Internal Medicine)    SUBJECTIVE: 32 y.o. male who has Male pattern baldness; ADD (attention deficit disorder); Anxiety; Moderate episode of recurrent major depressive disorder (HCC); Protrusion of lumbar intervertebral disc; and Bipolar 1 disorder (HCC) on their problem list.. Main reasons for visit/main concerns/chief complaint: Transfer of care, Discuss HPV vaccine (If he is too old to get this vaccine.), and STD screening (New partner.)   ------------------------------------------------------------------------------------------------------------------------ AI-Extracted: Discussed the use of AI scribe software for  clinical note transcription with the patient, who  gave verbal consent to proceed.  History of Present Illness   The patient, a Research scientist (physical sciences), presents with concerns about potential sexually transmitted infections (STIs) and a history of prostatitis. He reports recent changes in sexual partners and a desire for comprehensive STI testing, including herpes. The patient describes a history of prostatitis that predates his sexual activity, with a recent recurrence of symptoms that he initially attributed to this condition. However, he has also experienced new symptoms, including urethral irritation, dysuria, and unilateral testicular pain, which he suspects may indicate epididymitis or urethritis.  The patient also reports a history of mental health issues, including diagnosed bipolar disorder and ADHD. He has been stable on Lamictal for over a year and has recently started trialing Adderall for ADHD. He expresses a preference for not taking Adderall daily and has some remaining from a previous prescription. The patient also mentions a history of substance use, including LSD and other psychedelics, which he believes may have exacerbated his mental health issues.  In addition to these concerns, the patient has been experiencing hair thinning and has been using a topical finasteride and minoxidil solution obtained online. He expresses interest in obtaining this medication through a local pharmacy if the cost is comparable. He also mentions taking saw palmetto, an herbal supplement, for prostate health.  The patient's history of prostatitis, recent sexual activity, and current symptoms suggest a possible STI, which he is keen to investigate. His mental health is reportedly stable, but he acknowledges the need for ongoing monitoring, particularly during stressful periods. The patient's use of topical finasteride and saw palmetto indicates a proactive approach to managing his hair thinning and prostate health, respectively.       Note that patient  has a past  medical history of ADHD, Anxiety, Bipolar 1 disorder (HCC), Depression, Encounter for annual physical exam (07/08/2022), and Substance abuse (HCC).  Problem list overviews that were updated at today's visit: Problem  Male Pattern Baldness  Encounter for Annual Physical Exam (Resolved)    Med reconciliation: Current Outpatient Medications on File Prior to Visit  Medication Sig   ADDERALL XR 10 MG 24 hr capsule    lamoTRIgine (LAMICTAL) 100 MG tablet Take 0.5 tablets (50 mg total) by mouth daily. (Patient taking differently: Take 150 mg by mouth daily.)   No current facility-administered medications on file prior to visit.  There are no discontinued medications.   Objective   Physical Exam     04/14/2023   10:18 AM 03/17/2023    9:33 AM 07/28/2022    1:00 AM  Vitals with BMI  Height 5\' 10"  5\' 10"    Weight 167 lbs 6 oz 168 lbs   BMI 24.02 24.11   Systolic 117 130 161  Diastolic 64 77 97  Pulse 88 68 74   Wt Readings from Last 10 Encounters:  04/14/23 167 lb 6.4 oz (75.9 kg)  03/17/23 168 lb (76.2 kg)  07/08/22 174 lb (78.9 kg)  01/30/22 172 lb 12.8 oz (78.4 kg)  08/26/21 171 lb 6.4 oz (77.7 kg)  05/26/18 170 lb 6.4 oz (77.3 kg)  12/02/17 175 lb (79.4 kg)  10/08/17 168 lb 12.8 oz (76.6 kg)  07/03/16 180 lb 6.4 oz (81.8 kg)  07/24/15 180 lb 12.8 oz (82 kg)   Vital signs reviewed.  Nursing notes reviewed. Weight trend reviewed. Abnormalities and Problem-Specific physical exam findings:    General Appearance:  No acute distress appreciable.   Well-groomed, healthy-appearing  male.  Well proportioned with no abnormal fat distribution.  Good muscle tone. Pulmonary:  Normal work of breathing at rest, no respiratory distress apparent. SpO2: 100 %  Musculoskeletal: All extremities are intact.  Neurological:  Awake, alert, oriented, and engaged.  No obvious focal neurological deficits or cognitive impairments.  Sensorium seems unclouded.   Speech is clear and coherent with logical  content. Psychiatric:  Appropriate mood, pleasant and cooperative demeanor, thoughtful and engaged during the exam  Results            Results for orders placed or performed in visit on 04/14/23  Urinalysis, Routine w reflex microscopic  Result Value Ref Range   Color, Urine Lt. Yellow Yellow;Lt. Yellow;Straw;Dark Yellow;Amber;Green;Red;Brown   APPearance CLEAR Clear;Turbid;Slightly Cloudy;Cloudy   Specific Gravity, Urine 1.010 1.000 - 1.030   pH 7.5 5.0 - 8.0   Total Protein, Urine NEGATIVE Negative   Urine Glucose NEGATIVE Negative   Ketones, ur NEGATIVE Negative   Bilirubin Urine NEGATIVE Negative   Hgb urine dipstick NEGATIVE Negative   Urobilinogen, UA 0.2 0.0 - 1.0   Leukocytes,Ua NEGATIVE Negative   Nitrite NEGATIVE Negative   WBC, UA none seen 0-2/hpf   RBC / HPF none seen 0-2/hpf    Office Visit on 04/14/2023  Component Date Value   Color, Urine 04/14/2023 Lt. Yellow    APPearance 04/14/2023 CLEAR    Specific Gravity, Urine 04/14/2023 1.010    pH 04/14/2023 7.5    Total Protein, Urine 04/14/2023 NEGATIVE    Urine Glucose 04/14/2023 NEGATIVE    Ketones, ur 04/14/2023 NEGATIVE    Bilirubin Urine 04/14/2023 NEGATIVE    Hgb urine dipstick 04/14/2023 NEGATIVE    Urobilinogen, UA 04/14/2023 0.2    Leukocytes,Ua 04/14/2023 NEGATIVE    Nitrite 04/14/2023 NEGATIVE    WBC, UA 04/14/2023 none seen    RBC / HPF 04/14/2023 none seen   Office Visit on 03/17/2023  Component Date Value   Specific Gravity, UA 03/17/2023 1.015    pH, UA 03/17/2023 6.5    Color, UA 03/17/2023 Yellow    Appearance Ur 03/17/2023 Clear    Leukocytes,UA 03/17/2023 Negative    Protein,UA 03/17/2023 Negative    Glucose, UA 03/17/2023 Negative    Ketones, UA 03/17/2023 Negative    RBC, UA 03/17/2023 Negative    Bilirubin, UA 03/17/2023 Negative    Urobilinogen, Ur 03/17/2023 0.2    Nitrite, UA 03/17/2023 Negative    Microscopic Examination 03/17/2023 Comment    Scan Result 03/17/2023    Admission on 07/27/2022, Discharged on 07/28/2022  Component Date Value   WBC 07/27/2022 5.3    RBC 07/27/2022 4.60    Hemoglobin 07/27/2022 13.9    HCT 07/27/2022 39.6    MCV 07/27/2022 86.1    MCH 07/27/2022 30.2    MCHC 07/27/2022 35.1    RDW 07/27/2022 12.2    Platelets 07/27/2022 179    nRBC 07/27/2022 0.0    Neutrophils Relative % 07/27/2022 61    Neutro Abs 07/27/2022 3.3    Lymphocytes Relative 07/27/2022 28    Lymphs Abs 07/27/2022 1.5    Monocytes Relative 07/27/2022 9    Monocytes Absolute 07/27/2022 0.5    Eosinophils Relative 07/27/2022 1    Eosinophils Absolute 07/27/2022 0.1    Basophils Relative 07/27/2022 1    Basophils Absolute 07/27/2022 0.0    Immature Granulocytes 07/27/2022 0    Abs Immature Granulocytes 07/27/2022 0.01    Sodium 07/27/2022 142    Potassium 07/27/2022 3.9  Chloride 07/27/2022 103    CO2 07/27/2022 30    Glucose, Bld 07/27/2022 129 (H)    BUN 07/27/2022 18    Creatinine, Ser 07/27/2022 1.21    Calcium 07/27/2022 10.2    Total Protein 07/27/2022 7.5    Albumin 07/27/2022 5.1 (H)    AST 07/27/2022 22    ALT 07/27/2022 17    Alkaline Phosphatase 07/27/2022 51    Total Bilirubin 07/27/2022 0.4    GFR, Estimated 07/27/2022 >60    Anion gap 07/27/2022 9    Magnesium 07/27/2022 2.1    Total CK 07/27/2022 189    Color, Urine 07/27/2022 COLORLESS (A)    APPearance 07/27/2022 CLEAR    Specific Gravity, Urine 07/27/2022 1.009    pH 07/27/2022 5.5    Glucose, UA 07/27/2022 NEGATIVE    Hgb urine dipstick 07/27/2022 NEGATIVE    Bilirubin Urine 07/27/2022 NEGATIVE    Ketones, ur 07/27/2022 NEGATIVE    Protein, ur 07/27/2022 NEGATIVE    Nitrite 07/27/2022 NEGATIVE    Leukocytes,Ua 07/27/2022 NEGATIVE   Office Visit on 07/08/2022  Component Date Value   WBC 07/08/2022 2.9 (L)    RBC 07/08/2022 4.69    Hemoglobin 07/08/2022 14.1    HCT 07/08/2022 40.9    MCV 07/08/2022 87.4    MCHC 07/08/2022 34.3    RDW 07/08/2022 13.1     Platelets 07/08/2022 192.0    Neutrophils Relative % 07/08/2022 64.0    Lymphocytes Relative 07/08/2022 26.5    Monocytes Relative 07/08/2022 8.2    Eosinophils Relative 07/08/2022 0.9    Basophils Relative 07/08/2022 0.4    Neutro Abs 07/08/2022 1.8    Lymphs Abs 07/08/2022 0.8    Monocytes Absolute 07/08/2022 0.2    Eosinophils Absolute 07/08/2022 0.0    Basophils Absolute 07/08/2022 0.0    Sodium 07/08/2022 141    Potassium 07/08/2022 4.0    Chloride 07/08/2022 103    CO2 07/08/2022 30    Glucose, Bld 07/08/2022 102 (H)    BUN 07/08/2022 13    Creatinine, Ser 07/08/2022 0.95    Total Bilirubin 07/08/2022 0.5    Alkaline Phosphatase 07/08/2022 57    AST 07/08/2022 21    ALT 07/08/2022 18    Total Protein 07/08/2022 7.5    Albumin 07/08/2022 5.0    GFR 07/08/2022 107.38    Calcium 07/08/2022 9.9    Cholesterol 07/08/2022 260 (H)    Triglycerides 07/08/2022 115.0    HDL 07/08/2022 65.40    VLDL 07/08/2022 23.0    LDL Cholesterol 07/08/2022 171 (H)    Total CHOL/HDL Ratio 07/08/2022 4    NonHDL 07/08/2022 194.27    TSH 07/08/2022 1.72    No image results found.   No results found.  No results found.       Additional Info: This encounter employed real-time, collaborative documentation. The patient actively reviewed and updated their medical record on a shared screen, ensuring transparency and facilitating joint problem-solving for the problem list, overview, and plan. This approach promotes accurate, informed care. The treatment plan was discussed and reviewed in detail, including medication safety, potential side effects, and all patient questions. We confirmed understanding and comfort with the plan. Follow-up instructions were established, including contacting the office for any concerns, returning if symptoms worsen, persist, or new symptoms develop, and precautions for potential emergency department visits.

## 2023-04-15 ENCOUNTER — Encounter: Payer: Self-pay | Admitting: Urology

## 2023-04-15 LAB — URINE CYTOLOGY ANCILLARY ONLY
Chlamydia: NEGATIVE
Comment: NEGATIVE
Comment: NEGATIVE
Comment: NORMAL
Neisseria Gonorrhea: NEGATIVE
Trichomonas: NEGATIVE

## 2023-04-15 LAB — HIV ANTIBODY (ROUTINE TESTING W REFLEX): HIV 1&2 Ab, 4th Generation: NONREACTIVE

## 2023-04-15 LAB — CYTOLOGY, (ORAL, ANAL, URETHRAL) ANCILLARY ONLY
Chlamydia: NEGATIVE
Comment: NEGATIVE
Comment: NEGATIVE
Comment: NORMAL
Neisseria Gonorrhea: NEGATIVE
Trichomonas: NEGATIVE

## 2023-04-15 LAB — HSV(HERPES SIMPLEX VRS) I + II AB-IGG
HSV 1 IGG,TYPE SPECIFIC AB: <0.9 {index}
HSV 2 IGG,TYPE SPECIFIC AB: <0.9 {index}

## 2023-04-15 LAB — HEPATITIS C ANTIBODY: Hepatitis C Ab: NONREACTIVE

## 2023-04-15 LAB — RPR: RPR Ser Ql: NONREACTIVE

## 2023-04-16 ENCOUNTER — Ambulatory Visit: Payer: 59 | Admitting: Urology

## 2023-04-16 NOTE — Assessment & Plan Note (Signed)
He has a history of bipolar disorder, currently managed with Lamictal. We will continue Lamictal as prescribed and consider transferring psychiatric care to this practice if he is stable and comfortable with the change.

## 2023-04-16 NOTE — Assessment & Plan Note (Signed)
He is currently trialing Adderall for ADHD symptoms. We will continue Adderall as prescribed and consider transferring ADHD management to this practice if he is stable and comfortable with the change.

## 2023-04-17 ENCOUNTER — Encounter: Payer: Self-pay | Admitting: Internal Medicine

## 2023-04-22 ENCOUNTER — Other Ambulatory Visit (HOSPITAL_BASED_OUTPATIENT_CLINIC_OR_DEPARTMENT_OTHER): Payer: Self-pay

## 2023-04-22 ENCOUNTER — Encounter: Payer: Self-pay | Admitting: Urology

## 2023-04-22 ENCOUNTER — Ambulatory Visit: Payer: 59 | Admitting: Urology

## 2023-04-22 VITALS — BP 144/78 | HR 89 | Ht 70.0 in | Wt 167.0 lb

## 2023-04-22 DIAGNOSIS — N3943 Post-void dribbling: Secondary | ICD-10-CM | POA: Diagnosis not present

## 2023-04-22 DIAGNOSIS — R102 Pelvic and perineal pain: Secondary | ICD-10-CM | POA: Diagnosis not present

## 2023-04-22 DIAGNOSIS — R3989 Other symptoms and signs involving the genitourinary system: Secondary | ICD-10-CM

## 2023-04-22 LAB — BLADDER SCAN AMB NON-IMAGING

## 2023-04-22 MED ORDER — DOXYCYCLINE HYCLATE 100 MG PO CAPS
100.0000 mg | ORAL_CAPSULE | Freq: Two times a day (BID) | ORAL | 0 refills | Status: DC
  Filled 2023-04-22: qty 56, 28d supply, fill #0

## 2023-04-22 MED ORDER — CELECOXIB 200 MG PO CAPS
200.0000 mg | ORAL_CAPSULE | Freq: Two times a day (BID) | ORAL | 0 refills | Status: DC
  Filled 2023-04-22: qty 20, 10d supply, fill #0

## 2023-04-22 NOTE — Progress Notes (Signed)
04/22/23 4:33 PM   Jared Delacruz Sep 09, 1990 [id number]  CC: Pelvic pain, urinary symptoms, hematospermia  HPI: 32 year old male with at least a few months of the above symptoms.  He was also evaluated by Dr. Pete Glatter on 03/17/2019 for and treated with 2 weeks of Bactrim for possible UTI/prostatitis, patient reports no improvement after those antibiotics.  He has had extensive STD testing and urinalysis which have all been benign.  His primary complaint is sensation of "golf ball" in the rectum and "prostate pain" as well as discomfort and dysuria with urination and a sensation of blockage.  He denies any gross hematuria.  PVR today is normal at 4ml.  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   PMH: Past Medical History:  Diagnosis Date   ADHD    Anxiety    Bipolar 1 disorder (HCC)    Depression    Encounter for annual physical exam 07/08/2022   Substance abuse (HCC)    Alcohol, self-medication with mood modulating substances     Family History: Family History  Problem Relation Age of Onset   Rheum arthritis Mother    Anxiety disorder Mother    Cancer Father        salivary   ADD / ADHD Maternal Grandmother    Depression Maternal Aunt    Alcohol abuse Maternal Uncle     Social History:  reports that he has never smoked. He has never used smokeless tobacco. He reports that he does not currently use alcohol after a past usage of about 1.0 standard drink of alcohol per week. He reports that he does not use drugs.  Physical Exam: BP (!) 144/78 (BP Location: Left Arm, Patient Position: Sitting, Cuff Size: Normal)   Pulse 89   Ht 5\' 10"  (1.778 m)   Wt 167 lb (75.8 kg)   BMI 23.96 kg/m    Constitutional:  Alert and oriented, No acute distress. Cardiovascular: No clubbing, cyanosis, or edema. Respiratory: Normal respiratory effort, no increased work of breathing. GI: Abdomen is soft, nontender,  nondistended, no abdominal masses   Assessment & Plan:   32 year old male with 1 to 2 months of urethral irritation and discomfort, pelvic pain and rectal pain/pressure, STD testing and urinalysis have been benign.  He was treated by a different urologist with 2 weeks of Bactrim without any improvement in his symptoms.  He also has had intermittent hematospermia.  We discussed the complexities of pelvic pain and possible range of etiologies including prostatitis, urethritis, pelvic floor dysfunction, chronic bladder pain syndrome, and interstitial cystitis.  We reviewed the AUA guidelines that recommend an algorithmic approach to treatment for these patients, and that a trial of different medications and strategies is sometimes needed to find the approach that works best for each patient's unique situation.  I reinforced the importance of stress management, relaxation, avoiding triggers, and pain management in the approach to pelvic pain.  He was strongly interested in a longer course of doxycycline for possible urethritis/prostatitis, risks and benefits were discussed.  I also provided him with pelvic floor stretching exercises and a 2-week course of Celebrex.  He has already made an appointment with a pelvic floor physical therapist.  Return precautions were discussed extensively.  We also discussed urethral stricture can sometimes present with similar symptoms, and he defers cystoscopy at this time.  Return precautions were discussed at length.  RTC 4 weeks for symptom check, consider cystoscopy if no improvement  Legrand Rams, MD 04/22/2023  [facility] [address], [address] [place], [place] [address] [phone]

## 2023-04-22 NOTE — Patient Instructions (Signed)
Pelvic Floor Dysfunction, Male     Pelvic floor dysfunction (PFD) is a condition that results when the group of muscles and connective tissues that support the organs in the pelvis (pelvic floor muscles) do not work well. These muscles and their connections form a sling that supports the colon and bladder. In men, these muscles also support the prostate gland. PFD causes pelvic floor muscles to be too weak, too tight, or both. In PFD, muscle movements are not coordinated. This may cause bowel or bladder problems. It may also cause pain. What are the causes? This condition may be caused by an injury to the pelvic area or by a weakening of pelvic muscles. In many cases, the exact cause is not known. What increases the risk? The following factors may make you more likely to develop PFD: Having chronic bladder tissue inflammation (interstitial cystitis). Being an older person. Being overweight. History of radiation treatment for cancer in the pelvic region. Previous pelvic surgery, such as removal of the prostate gland (prostatectomy). What are the signs or symptoms? Symptoms of this condition vary and may include: Bladder symptoms, such as: Trouble starting urination and emptying the bladder. Frequent urinary tract infections. Leaking urine when coughing, laughing, or exercising (stress incontinence). Having to pass urine urgently or frequently. Pain when passing urine. Bowel symptoms, such as: Constipation. Urgent or frequent bowel movements. Incomplete bowel movements. Painful bowel movements. Leaking stool or gas. Unexplained genital or rectal pain. Genital or rectal muscle spasms. Low back pain. Sexual dysfunction, such as erectile dysfunction, premature ejaculation, or pain during or after sexual activity. How is this diagnosed? This condition is diagnosed based on: Your symptoms and medical history. A physical exam. During the exam, your health care provider may check your  pelvic muscles for tightness, spasm, pain, or weakness. This may include a rectal exam. In some cases, you may have diagnostic tests, such as: Electrical muscle function tests. Urine flow testing. X-ray tests of bowel function. Ultrasound of the pelvic organs. How is this treated? Treatment for this condition depends on your symptoms. Treatment options include: Physical therapy. This may include Kegel exercises to help relax or strengthen the pelvic floor muscles. Biofeedback. This type of therapy provides feedback on how tight your pelvic floor muscles are so that you can learn to control them. Massage therapy. A treatment that involves electrical stimulation of the pelvic floor muscles to help control pain (transcutaneous electrical nerve stimulation, or TENS). Sound wave therapy (ultrasound) to reduce muscle spasms. Medicines, such as: Muscle relaxants. Bladder control medicines. Surgery to reconstruct or support pelvic floor muscles may be an option if other treatments do not help. Follow these instructions at home: Activity Do your usual activities as told by your health care provider. Ask your health care provider if you should modify any activities. Do pelvic floor strengthening or relaxing exercises at home as told by your physical therapist. Lifestyle Maintain a healthy weight. Eat foods that are high in fiber, such as beans, whole grains, and fresh fruits and vegetables. Limit foods that are high in fat and processed sugars, such as fried or sweet foods. Manage stress with relaxation techniques such as yoga or meditation. General instructions If you have problems with leakage: Use absorbable pads or wear padded underwear. Wash your genital and anal area frequently with mild soap. Keep your genital and anal area as clean and dry as possible. Ask your health care provider if you should try a barrier cream to prevent skin irritation. Take warm baths  to relieve pelvic muscle  tension or spasms. Take over-the-counter and prescription medicines only as told by your health care provider. Keep all follow-up visits. How is this prevented? The cause of PFD is not always known, but there are a few things you can do to reduce the risk of developing this condition, including: Staying at a healthy weight. Getting regular exercise. Managing stress. Contact a health care provider if: Your symptoms are not improving with home care. You have signs or symptoms of PFD that get worse. You develop new signs or symptoms. You have signs of a urinary tract infection, such as: Fever. Chills. Increased urinary frequency. A burning feeling when urinating. You have not had a bowel movement in 3 days (constipation). Summary Pelvic floor dysfunction results when the muscles and connective tissues in your pelvic floor do not work well. These muscles and their connections form a sling that supports your colon and bladder. In men, these muscles also support the prostate gland. PFD may be caused by an injury to the pelvic area or by a weakening of pelvic muscles. PFD causes pelvic floor muscles to be too weak, too tight, or a combination of both. Symptoms may vary from person to person. In most cases, PFD can be treated with physical therapies and medicines. Surgery may be an option if other treatments do not help. This information is not intended to replace advice given to you by your health care provider. Make sure you discuss any questions you have with your health care provider. Document Revised: 10/03/2020 Document Reviewed: 10/03/2020 Elsevier Patient Education  2024 [employer]  Urethritis, Adult  Urethritis is a swelling (inflammation) of the urethra. The urethra is the tube that drains urine from the bladder. It is important to get treatment for this condition early. Delayed treatment may lead to complications, such as an infection in the urinary tract (ureters, kidneys, and  bladder). What are the causes? This condition may be caused by: Germs that are spread through sexual contact. This is the leading cause of urethritis. This may include bacterial or viral infections. Injury to the urethra. This can happen after a thin, flexible tube (catheter) is inserted into the urethra to drain urine, or after medical instruments or foreign bodies are inserted into the area. Chemical irritation. This may include contact with spermicide or prolonged contact with chemicals in bubble bath, shampoo, or perfumed soaps. A disease that causes inflammation. This is rare. What increases the risk? The following factors may make you more likely to develop this condition: Having sex without using a condom. Having multiple sexual partners. Having poor hygiene. What are the signs or symptoms? Symptoms of this condition include: Pain with urination. Frequent urination. An urgent need to urinate. Itching and pain in the vagina or penis. Discharge or bleeding coming from the penis. Most women have no symptoms. How is this diagnosed? This condition may be diagnosed based on: Your symptoms. Your medical history. A physical exam. Tests may also be done. These may include: Urine tests. Swabs from the urethra. How is this treated? Treatment for this condition depends on the cause. Urethritis caused by a bacterial infection is treated with antibiotic medicine. Sexual partners must also be treated. Follow these instructions at home: Medicines Take over-the-counter and prescription medicines only as told by your health care provider. If you were prescribed an antibiotic, take it as told by your health care provider. Do not stop taking the antibiotic even if you start to feel better. Lifestyle Avoid using  perfumed soaps, bubble bath, and shampoo when you bathe or shower. Rinse the vaginal area after bathing. Wear cotton underwear. Not wearing underwear when going to sleep can  help. Make sure to wipe from front to back after using the toilet if you are male. Do not have sex until your health care provider approves. When you do have sex, be sure to practice safe sex. Any sexual partners you have had in the past 60 days should be treated. General instructions Drink enough fluid to keep your urine pale yellow. It is up to you to get your test results. Ask your health care provider, or the department that is doing the test, when your results will be ready. Keep all follow-up visits. This is important. Get tested again 3 months after treatment to make sure the infection is gone. It is important that your sexual partner also gets tested again. Contact a health care provider if: Your symptoms have not improved after 3 days. Your symptoms get worse. You have eye redness or pain. You develop abdominal pain or pelvic pain (in females). You develop joint pain or a rash. You have a fever or chills. Get help right away if: You have severe pain in the belly, back, or side. You vomit repeatedly. Summary Urethritis is a swelling (inflammation) of the urethra. Germs that are spread through sexual contact are the most common cause of this condition. It is important to get treatment for this condition early. Delayed treatment may lead to complications. Treatment for this condition depends on the cause. Any sexual partners must also be treated. This information is not intended to replace advice given to you by your health care provider. Make sure you discuss any questions you have with your health care provider. Document Revised: 01/01/2020 Document Reviewed: 01/01/2020 Elsevier Patient Education  2024 [employer].  Prostatitis  Prostatitis is swelling or inflammation of the prostate gland, also called the prostate. This gland is about 1.5 inches wide and 1 inch high, and it is involved in making semen. The prostate is located below a man's bladder, in front of the  rectum. There are four types of prostatitis: Chronic prostatitis (CP), also called chronic pelvic pain syndrome (CPPS). This is the most common type of prostatitis. It is associated with increased muscle tone in the area between the hip bones (pelvic area), around the prostate. This type is also known as a pelvic floor disorder. Chronic bacterial prostatitis. This type usually results from an acute bacterial infection in the prostate gland that keeps coming back or has not been treated properly. The symptoms are less severe than those caused by acute bacterial prostatitis, which lasts a shorter time. Asymptomatic inflammatory prostatitis. This type does not have symptoms and does not need treatment. This is diagnosed when tests are done for other disorders of the urinary tract or reproductive tract. Acute bacterial prostatitis. This type starts quickly and results from an acute bacterial infection in the prostate gland. It is usually associated with a bladder infection, high fever, and chills. This is the least common type of prostatitis. What are the causes? Bacterial prostatitis is caused by an infection from bacteria. Chronic nonbacterial prostatitis may be caused by: Factors related to the nervous system. This system includes thebrain, spinal cord, and nerves. An autoimmune response. This happens when the body's disease-fighting system attacks healthy tissue in the body by mistake. Psychological factors. These have to do with how the mind works. The causes of the other types of prostatitis are usually  not known. What are the signs or symptoms? Symptoms of this condition depend on the type of prostatitis you have. Acute bacterial prostatitis Symptoms may include: Pain or burning during urination. Frequent and sudden urges to urinate. Trouble starting to urinate. Fever. Chills. Pain in your muscles or joints, lower back, or lower abdomen. Other types of prostatitis Symptoms may  include: Sudden urges to urinate, or urinating often. Trouble starting to urinate. Weak urine stream. Dribbling after urination. Discharge coming from the penis. Pain in the testicles, the penis, or the tip of the penis. Pain in the area in front of the rectum and below the scrotum (perineum). Pain when ejaculating. How is this diagnosed? This condition may be diagnosed based on: A physical and medical exam. A digital rectal exam. For this, the health care provider may use a finger to feel the prostate. A urine test to check for bacteria. A semen sample or blood tests. Ultrasound. Urodynamic tests to check how your body handles urine. Cystoscopy to look inside your bladder or inside the part of your body that drains urine from the bladder (urethra). How is this treated? Treatment for this condition depends on the type of prostatitis. Treatment may involve: Medicines to relieve pain or inflammation, or to help relax your muscles. Physical therapy. Heat therapy. Biofeedback. These techniques help you control certain body functions. Relaxation exercises. Antibiotic medicine, if your condition is caused by bacteria. Sitz baths. These warm water baths help to relax your pelvic floor muscles, which helps to relieve pressure on the prostate. Follow these instructions at home: Medicines Take over-the-counter and prescription medicines only as told by your health care provider. If you were prescribed an antibiotic medicine, take it as told by your health care provider. Do not stop using the antibiotic even if you start to feel better. Managing pain and swelling  Take sitz baths as directed by your health care provider. For a sitz bath, sit in warm water that is deep enough to cover your hips and buttocks. If directed, apply heat to the affected area as often as told by your health care provider. Use the heat source that your health care provider recommends, such as a moist heat pack or a  heating pad. Place a towel between your skin and the heat source. Leave the heat on for 20-30 minutes. Remove the heat if your skin turns bright red. This is especially important if you are unable to feel pain, heat, or cold. You may have a greater risk of getting burned. General instructions Do exercises as told by your health care provider, if you were prescribed physical therapy, biofeedback, or relaxation exercises. Keep all follow-up visits as told by your health care provider. This is important. Where to find more information [employer] of Diabetes and Digestive and Kidney Diseases: [employer] Contact a health care provider if: Your symptoms get worse. You have a fever. Get help right away if: You have chills. You feel light-headed or feel like you may faint. You cannot urinate. You have blood or blood clots in your urine. Summary Prostatitis is swelling or inflammation of the prostate gland. Treatment for this condition depends on the type of prostatitis. Take over-the-counter and prescription medicines only as told by your health care provider. Get help right away of you have chills, feel light-headed, feel like you may faint, cannot urinate, or have blood or blood clots in your urine. This information is not intended to replace advice given to you by your health care provider.  Make sure you discuss any questions you have with your health care provider. Document Revised: 04/10/2022 Document Reviewed: 04/10/2022 Elsevier Patient Education  2024 [employer].

## 2023-05-03 ENCOUNTER — Encounter (INDEPENDENT_AMBULATORY_CARE_PROVIDER_SITE_OTHER): Payer: 59 | Admitting: Internal Medicine

## 2023-05-03 DIAGNOSIS — F908 Attention-deficit hyperactivity disorder, other type: Secondary | ICD-10-CM

## 2023-05-03 DIAGNOSIS — F319 Bipolar disorder, unspecified: Secondary | ICD-10-CM

## 2023-05-03 DIAGNOSIS — F331 Major depressive disorder, recurrent, moderate: Secondary | ICD-10-CM

## 2023-05-05 DIAGNOSIS — F908 Attention-deficit hyperactivity disorder, other type: Secondary | ICD-10-CM | POA: Diagnosis not present

## 2023-05-05 NOTE — Telephone Encounter (Signed)
MYCHART ENCOUNTER DOCUMENTATION Encounter Type: MyChart secure digital messaging clinical encounter Chief Complaint: Request for psychiatric referral and counseling services Relevant History:  Known history of ADHD Anxiety Bipolar 1 disorder (HCC) Depression Substance abuse (alcohol) No recent psychiatric care documented  Assessment: Patient demonstrates insight into need for psychiatric care and counseling services. Given complex psychiatric history including Bipolar 1 disorder (HCC) and comorbid conditions, specialized psychiatric care is medically necessary. Plan:  Provided referral information for [facility] Offered multiple options for counseling services Included crisis resources and emergency protocols Provided contact information for sliding scale services Referenced SAMHSA treatment guidelines for integrated care approach ([email or web address])  Patient Education:  Educated about available mental health resources Provided crisis intervention information Discussed importance of integrated care approach for concurrent mental health conditions Informed about potential insurance coverage and sliding scale options  Follow-up:  Recommended scheduling with psychiatrist within 4 weeks Patient encouraged to follow up via MyChart with any barriers to accessing care Will monitor for successful connection to mental health services  Medical Decision Making: Moderate complexity due to:  Multiple psychiatric diagnoses including HCC condition (Bipolar 1) Active substance use history requiring careful coordination of care Need for multiple levels of intervention (psychiatric, counseling, potential crisis services)  Please see the MyChart message reply(ies) for my assessment and plan. This patient gave consent for this Medical Advice Message and is aware that it may result in a bill to [employer], as well as the possibility of  receiving a bill for a co-payment or deductible. They are an established patient, but are not seeking medical advice exclusively about a problem treated during an in person or video visit in the last seven days. I did not recommend an in person or video visit within seven days of my reply. I spent a total of 8 minutes cumulative time within 7 days through [employer]. Lula Olszewski, MD

## 2023-05-12 ENCOUNTER — Ambulatory Visit: Payer: 59 | Admitting: Cardiology

## 2023-05-14 ENCOUNTER — Ambulatory Visit (INDEPENDENT_AMBULATORY_CARE_PROVIDER_SITE_OTHER): Payer: 59 | Admitting: Internal Medicine

## 2023-05-14 ENCOUNTER — Other Ambulatory Visit (HOSPITAL_BASED_OUTPATIENT_CLINIC_OR_DEPARTMENT_OTHER): Payer: Self-pay

## 2023-05-14 VITALS — BP 112/69 | HR 85 | Temp 97.7°F | Ht 70.0 in | Wt 168.4 lb

## 2023-05-14 DIAGNOSIS — N3281 Overactive bladder: Secondary | ICD-10-CM | POA: Diagnosis not present

## 2023-05-14 DIAGNOSIS — G894 Chronic pain syndrome: Secondary | ICD-10-CM | POA: Diagnosis not present

## 2023-05-14 DIAGNOSIS — F319 Bipolar disorder, unspecified: Secondary | ICD-10-CM | POA: Diagnosis not present

## 2023-05-14 DIAGNOSIS — N411 Chronic prostatitis: Secondary | ICD-10-CM

## 2023-05-14 DIAGNOSIS — Z23 Encounter for immunization: Secondary | ICD-10-CM

## 2023-05-14 DIAGNOSIS — F908 Attention-deficit hyperactivity disorder, other type: Secondary | ICD-10-CM

## 2023-05-14 MED ORDER — MIRABEGRON ER 25 MG PO TB24
25.0000 mg | ORAL_TABLET | Freq: Every day | ORAL | 0 refills | Status: DC
  Filled 2023-05-14: qty 10, 10d supply, fill #0
  Filled 2023-05-14: qty 2, 2d supply, fill #0

## 2023-05-14 MED ORDER — CELECOXIB 200 MG PO CAPS
200.0000 mg | ORAL_CAPSULE | Freq: Two times a day (BID) | ORAL | 2 refills | Status: DC
  Filled 2023-05-14: qty 60, 30d supply, fill #0
  Filled 2023-06-16: qty 60, 30d supply, fill #1
  Filled 2023-07-22: qty 60, 30d supply, fill #2

## 2023-05-14 MED ORDER — OXYBUTYNIN CHLORIDE 5 MG PO TABS
5.0000 mg | ORAL_TABLET | Freq: Three times a day (TID) | ORAL | 11 refills | Status: DC
  Filled 2023-05-14: qty 30, 10d supply, fill #0
  Filled 2023-06-11: qty 30, 10d supply, fill #1

## 2023-05-14 NOTE — Assessment & Plan Note (Signed)
Attention Deficit Disorder (ADD)   He trialed Adderall for ADD but discontinued it due to mood fluctuations and currently is not on medication for ADD. We discussed the risks of stimulant use in bipolar disorder and his preference to avoid medication at this time. There is no current plan for ADD medication.

## 2023-05-14 NOTE — Progress Notes (Signed)
[facility]: [phone]   -- Medical Office Visit --  Patient:  Jared Delacruz      Age: 32 y.o.       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 Differential diagnoses include prostatitis, interstitial cystitis, and chronic pelvic pain syndrome. Symptoms alleviated by Celebrex indicate an inflammatory component. We discussed the potential autoimmune etiology and pelvic floor dysfunction secondary to a slipped disc. He provided informed consent for cystoscopy, understanding the risks of anesthesia and potential biopsy. We will continue Celebrex as needed for inflammation, refer him to urology for cystoscopy, trial Mirabegron for detrusor hyperreflexia, and consider pelvic floor therapy for muscle relaxation.   Given the patient's recurring genitourinary symptoms localized to the urethral prostate and bladder, with negative urinalysis and STI screening, and no response to antibiotics but improvement with Celebrex, here is a comprehensive differential diagnosis and plan for next steps:  Differential Diagnosis Chronic Prostatitis/Chronic Pelvic Pain Syndrome (CP/CPPS) Often presents with pelvic pain, urinary symptoms, and sexual dysfunction. Negative cultures and lack of response to antibiotics are common1. Interstitial Cystitis/Bladder Pain Syndrome (IC/BPS) Characterized by bladder pain, urinary urgency, and frequency without infection. Often responds to anti-inflammatory medications like Celebrex2. Urethral  Syndrome Symptoms similar to UTI but without bacterial infection. Can be associated with inflammation or irritation of the urethra3. Nonbacterial Prostatitis Inflammation of the prostate without bacterial infection. Symptoms include pelvic pain and urinary issues4. Pelvic Floor Dysfunction Can cause urinary symptoms due to muscle spasm or dysfunction. Often associated with chronic pelvic pain5. Bladder Outlet Obstruction Could be due to benign prostatic hyperplasia (BPH) or urethral stricture. May present with urinary retention and recurrent infections. Plan for Next Steps Detailed History and Physical Examination Focus on symptom duration, severity, and any associated factors. Perform a thorough genitourinary and pelvic examination. Advanced Diagnostic Testing Cystoscopy: To visualize the bladder and urethra for any abnormalities. Urodynamic Studies: To assess bladder function and identify any obstruction. Pelvic Ultrasound or MRI: To evaluate the prostate and surrounding structures. Symptom Management Continue Celebrex if effective. Consider other anti-inflammatory medications or pain management strategies. Referral to Specialists Urologist: For further evaluation and management of complex cases. Pelvic Floor Physical Therapist: If pelvic floor dysfunction is suspected. Lifestyle and Behavioral Interventions Encourage hydration and avoidance of bladder irritants (e.g., caffeine, alcohol). Consider bladder training exercises and pelvic floor relaxation techniques. Follow-Up Regular follow-up to monitor symptoms and response to treatment. Adjust treatment plan based on patient progress and any new findings. Bipolar 1 disorder (HCC) Bipolar Disorder   His bipolar disorder is well-managed on Lamictal. He experienced mood fluctuations with Adderall, which has been discontinued. He is managing well without additional medications. We will continue Lamictal and he will follow up with a  bipolar specialist at [facility] in [place].  Anxiety   His anxiety has increased related to a new relationship, with symptoms fluctuating but currently manageable. Lamictal has been effective in reducing anxiety. We discussed non-pharmacological strategies and the potential need for psychiatric consultation if symptoms worsen. We will monitor anxiety symptoms and consult a psychiatrist if symptoms worsen. Detrusor hyperreflexia of bladder Possible explanation for genitourinary symptom(s), will trial oxybutynin and myrbetriq and follow  up with urology. Need for HPV vaccination  Other specified attention deficit hyperactivity disorder (ADHD) Attention Deficit Disorder (ADD)   He trialed Adderall for ADD but discontinued it due to mood fluctuations and currently is not on medication for ADD. We discussed the risks of stimulant use in bipolar disorder and his preference to avoid medication at this time. There is no current plan for ADD medication.     Orders Placed During this Encounter:   General Health Maintenance   He was tested for HPV and plans to receive the Gardasil vaccine. We discussed the benefits of vaccination in preventing HPV-related diseases. We will administer the Gardasil vaccine. Diagnoses and all orders for this visit: Need for HPV vaccination -     HPV 9-valent vaccine,Recombinat  Recommended follow-up: as needed, depending on specialty follow up  and symptom(s) , at least annual Future Appointments  Date Time Provider Department Center  06/16/2023  8:30 AM Sondra Come, MD [facility] None  06/19/2023  9:00 AM French Ana, [facility] None  07/10/2023  8:40 AM [place], Peter M, MD [facility] None  Patient Care Team: Lula Olszewski, MD as PCP - General (Internal Medicine)    SUBJECTIVE: 32 y.o. male who has Male pattern baldness; ADD (attention deficit disorder); Anxiety; Moderate episode of recurrent major depressive disorder (HCC); Protrusion of lumbar  intervertebral disc; and Bipolar 1 disorder (HCC) on their problem list.  Main reasons for visit/main concerns/chief complaint: 1 month follow-up and Discuss referral to Cardiology    AI-Extracted: Discussed the use of AI scribe software for clinical note transcription with the patient, who gave verbal consent to proceed.  History of Present Illness The patient, with a history of bipolar disorder, anxiety, attention deficit disorder, male pattern baldness, depression, and a slipped disc, presents with persistent genitourinary symptoms. The patient reports a burning sensation and irritation in the urethra, pain in the bladder, and a history of back injuries. Despite previous treatment with doxycycline and Bactrim, the patient's symptoms have not improved significantly, leading to a suspicion of a non-bacterial cause.  The patient also reports a history of substance abuse, which has been managed successfully for some time. Recently, the patient trialed Adderall for attention deficit disorder but decided to discontinue due to mood fluctuations and disruption to routine.  The patient has been experiencing increased anxiety related to a new relationship, describing it as overwhelming and causing disruption to his routine. However, the patient has decided against anti-anxiety medication at this time, preferring to manage the symptoms independently.  The patient has also been experiencing chronic pelvic pain, which he believes may be related to a slipped disc and subsequent muscle tension in the pelvic floor. The patient reports a history of yoga practice, which he believes may have contributed to the tension in the pelvic floor. The patient's urinary stream is described as intermittent, leading to a suspicion of an enlarged prostate, although this has not been confirmed by examination.  The patient has been managing his symptoms with Celebrex, which has provided some relief, and has been exploring the use  of supplements and pelvic floor therapy.   Note that patient  has a past medical history of ADHD, Anxiety, Bipolar 1 disorder (HCC), Depression, Encounter for annual physical exam (07/08/2022), and Substance abuse (HCC).  Problem list overviews that were updated at today's visit:No problems updated.  Med reconciliation: Current Outpatient Medications on File Prior to Visit  Medication Sig   celecoxib (CELEBREX) 200 MG capsule Take 1 capsule (  200 mg total) by mouth 2 (two) times daily.   lamoTRIgine (LAMICTAL) 100 MG tablet Take 0.5 tablets (50 mg total) by mouth daily. (Patient taking differently: Take 150 mg by mouth daily.)   No current facility-administered medications on file prior to visit.   Medications Discontinued During This Encounter  Medication Reason   doxycycline (VIBRAMYCIN) 100 MG capsule Completed Course     Objective   Physical Exam     05/14/2023    9:00 AM 04/22/2023    2:35 PM 04/14/2023   10:18 AM  Vitals with BMI  Height 5\' 10"  5\' 10"  5\' 10"   Weight 168 lbs 6 oz 167 lbs 167 lbs 6 oz  BMI 24.16 23.96 24.02  Systolic 112 144 696  Diastolic 69 78 64  Pulse 85 89 88   Wt Readings from Last 10 Encounters:  05/14/23 168 lb 6.4 oz (76.4 kg)  04/22/23 167 lb (75.8 kg)  04/14/23 167 lb 6.4 oz (75.9 kg)  03/17/23 168 lb (76.2 kg)  07/08/22 174 lb (78.9 kg)  01/30/22 172 lb 12.8 oz (78.4 kg)  08/26/21 171 lb 6.4 oz (77.7 kg)  05/26/18 170 lb 6.4 oz (77.3 kg)  12/02/17 175 lb (79.4 kg)  10/08/17 168 lb 12.8 oz (76.6 kg)   Vital signs reviewed.  Nursing notes reviewed. Weight trend reviewed. Abnormalities and Problem-Specific physical exam findings:  relaxed  General Appearance:  No acute distress appreciable.   Well-groomed, healthy-appearing male.  Well proportioned with no abnormal fat distribution.  Good muscle tone. Pulmonary:  Normal work of breathing at rest, no respiratory distress apparent. SpO2: 100 %  Musculoskeletal: All extremities are intact.   Neurological:  Awake, alert, oriented, and engaged.  No obvious focal neurological deficits or cognitive impairments.  Sensorium seems unclouded.   Speech is clear and coherent with logical content. Psychiatric:  Appropriate mood, pleasant and cooperative demeanor, thoughtful and engaged during the exam  Results         Last CBC Lab Results  Component Value Date   WBC 5.3 07/27/2022   HGB 13.9 07/27/2022   HCT 39.6 07/27/2022   MCV 86.1 07/27/2022   MCH 30.2 07/27/2022   RDW 12.2 07/27/2022   PLT 179 07/27/2022   Last metabolic panel Lab Results  Component Value Date   GLUCOSE 129 (H) 07/27/2022   NA 142 07/27/2022   K 3.9 07/27/2022   CL 103 07/27/2022   CO2 30 07/27/2022   BUN 18 07/27/2022   CREATININE 1.21 07/27/2022   GFRNONAA >60 07/27/2022   CALCIUM 10.2 07/27/2022   PROT 7.5 07/27/2022   ALBUMIN 5.1 (H) 07/27/2022   BILITOT 0.4 07/27/2022   ALKPHOS 51 07/27/2022   AST 22 07/27/2022   ALT 17 07/27/2022   ANIONGAP 9 07/27/2022   Last lipids Lab Results  Component Value Date   CHOL 260 (H) 07/08/2022   HDL 65.40 07/08/2022   LDLCALC 171 (H) 07/08/2022   TRIG 115.0 07/08/2022   CHOLHDL 4 07/08/2022   Last hemoglobin A1c No results found for: "HGBA1C" Last thyroid functions Lab Results  Component Value Date   TSH 1.72 07/08/2022   Last vitamin D No results found for: "25OHVITD2", "25OHVITD3", "VD25OH" Last vitamin B12 and Folate No results found for: "VITAMINB12", "FOLATE"     No results found for any visits on 05/14/23.  Office Visit on 04/22/2023  Component Date Value   Scan Result 04/22/2023 4ml   Office Visit on 04/14/2023  Component Date Value   RPR  Ser Ql 04/14/2023 NON-REACTIVE    Neisseria Gonorrhea 04/14/2023 Negative    Chlamydia 04/14/2023 Negative    Trichomonas 04/14/2023 Negative    Comment 04/14/2023 Normal Reference Range Trichomonas - Negative    Comment 04/14/2023 Normal Reference Ranger Chlamydia - Negative    Comment  04/14/2023 Normal Reference Range Neisseria Gonorrhea - Negative    HAV 1 IGG,TYPE SPECIFIC * 04/14/2023 <0.90    HSV 2 IGG,TYPE SPECIFIC * 04/14/2023 <0.90    HIV 1&2 Ab, 4th Generati* 04/14/2023 NON-REACTIVE    Hepatitis C Ab 04/14/2023 NON-REACTIVE    Color, Urine 04/14/2023 Lt. Yellow    APPearance 04/14/2023 CLEAR    Specific Gravity, Urine 04/14/2023 1.010    pH 04/14/2023 7.5    Total Protein, Urine 04/14/2023 NEGATIVE    Urine Glucose 04/14/2023 NEGATIVE    Ketones, ur 04/14/2023 NEGATIVE    Bilirubin Urine 04/14/2023 NEGATIVE    Hgb urine dipstick 04/14/2023 NEGATIVE    Urobilinogen, UA 04/14/2023 0.2    Leukocytes,Ua 04/14/2023 NEGATIVE    Nitrite 04/14/2023 NEGATIVE    WBC, UA 04/14/2023 none seen    RBC / HPF 04/14/2023 none seen    Neisseria Gonorrhea 04/14/2023 Negative    Chlamydia 04/14/2023 Negative    Trichomonas 04/14/2023 Negative    Comment 04/14/2023 Normal Reference Range Trichomonas - Negative    Comment 04/14/2023 Normal Reference Ranger Chlamydia - Negative    Comment 04/14/2023 Normal Reference Range Neisseria Gonorrhea - Negative   Office Visit on 03/17/2023  Component Date Value   Specific Gravity, UA 03/17/2023 1.015    pH, UA 03/17/2023 6.5    Color, UA 03/17/2023 Yellow    Appearance Ur 03/17/2023 Clear    Leukocytes,UA 03/17/2023 Negative    Protein,UA 03/17/2023 Negative    Glucose, UA 03/17/2023 Negative    Ketones, UA 03/17/2023 Negative    RBC, UA 03/17/2023 Negative    Bilirubin, UA 03/17/2023 Negative    Urobilinogen, Ur 03/17/2023 0.2    Nitrite, UA 03/17/2023 Negative    Microscopic Examination 03/17/2023 Comment    Scan Result 03/17/2023   Admission on 07/27/2022, Discharged on 07/28/2022  Component Date Value   WBC 07/27/2022 5.3    RBC 07/27/2022 4.60    Hemoglobin 07/27/2022 13.9    HCT 07/27/2022 39.6    MCV 07/27/2022 86.1    MCH 07/27/2022 30.2    MCHC 07/27/2022 35.1    RDW 07/27/2022 12.2    Platelets  07/27/2022 179    nRBC 07/27/2022 0.0    Neutrophils Relative % 07/27/2022 61    Neutro Abs 07/27/2022 3.3    Lymphocytes Relative 07/27/2022 28    Lymphs Abs 07/27/2022 1.5    Monocytes Relative 07/27/2022 9    Monocytes Absolute 07/27/2022 0.5    Eosinophils Relative 07/27/2022 1    Eosinophils Absolute 07/27/2022 0.1    Basophils Relative 07/27/2022 1    Basophils Absolute 07/27/2022 0.0    Immature Granulocytes 07/27/2022 0    Abs Immature Granulocytes 07/27/2022 0.01    Sodium 07/27/2022 142    Potassium 07/27/2022 3.9    Chloride 07/27/2022 103    CO2 07/27/2022 30    Glucose, Bld 07/27/2022 129 (H)    BUN 07/27/2022 18    Creatinine, Ser 07/27/2022 1.21    Calcium 07/27/2022 10.2    Total Protein 07/27/2022 7.5    Albumin 07/27/2022 5.1 (H)    AST 07/27/2022 22    ALT 07/27/2022 17    Alkaline Phosphatase 07/27/2022 51  Total Bilirubin 07/27/2022 0.4    GFR, Estimated 07/27/2022 >60    Anion gap 07/27/2022 9    Magnesium 07/27/2022 2.1    Total CK 07/27/2022 189    Color, Urine 07/27/2022 COLORLESS (A)    APPearance 07/27/2022 CLEAR    Specific Gravity, Urine 07/27/2022 1.009    pH 07/27/2022 5.5    Glucose, UA 07/27/2022 NEGATIVE    Hgb urine dipstick 07/27/2022 NEGATIVE    Bilirubin Urine 07/27/2022 NEGATIVE    Ketones, ur 07/27/2022 NEGATIVE    Protein, ur 07/27/2022 NEGATIVE    Nitrite 07/27/2022 NEGATIVE    Leukocytes,Ua 07/27/2022 NEGATIVE   Office Visit on 07/08/2022  Component Date Value   WBC 07/08/2022 2.9 (L)    RBC 07/08/2022 4.69    Hemoglobin 07/08/2022 14.1    HCT 07/08/2022 40.9    MCV 07/08/2022 87.4    MCHC 07/08/2022 34.3    RDW 07/08/2022 13.1    Platelets 07/08/2022 192.0    Neutrophils Relative % 07/08/2022 64.0    Lymphocytes Relative 07/08/2022 26.5    Monocytes Relative 07/08/2022 8.2    Eosinophils Relative 07/08/2022 0.9    Basophils Relative 07/08/2022 0.4    Neutro Abs 07/08/2022 1.8    Lymphs Abs 07/08/2022 0.8     Monocytes Absolute 07/08/2022 0.2    Eosinophils Absolute 07/08/2022 0.0    Basophils Absolute 07/08/2022 0.0    Sodium 07/08/2022 141    Potassium 07/08/2022 4.0    Chloride 07/08/2022 103    CO2 07/08/2022 30    Glucose, Bld 07/08/2022 102 (H)    BUN 07/08/2022 13    Creatinine, Ser 07/08/2022 0.95    Total Bilirubin 07/08/2022 0.5    Alkaline Phosphatase 07/08/2022 57    AST 07/08/2022 21    ALT 07/08/2022 18    Total Protein 07/08/2022 7.5    Albumin 07/08/2022 5.0    GFR 07/08/2022 107.38    Calcium 07/08/2022 9.9    Cholesterol 07/08/2022 260 (H)    Triglycerides 07/08/2022 115.0    HDL 07/08/2022 65.40    VLDL 07/08/2022 23.0    LDL Cholesterol 07/08/2022 171 (H)    Total CHOL/HDL Ratio 07/08/2022 4    NonHDL 07/08/2022 194.27    TSH 07/08/2022 1.72         Attestation:  I have personally spent  37 minutes involved in face-to-face and non-face-to-face activities for this patient on the day of the visit. Professional time spent includes the following activities:  Preparing to see the patient by reviewing medical records prior to and during the encounter; Obtaining, documenting, and reviewing an updated medical history; Performing a medically appropriate examination;  Evaluating, synthesizing, and documenting the available clinical information in the EMR;  Coordinating/Communicating with other health care professionals; Communicating, counseling, educating about results to the patient(not separately reported); Collaboratively developing and communicating an individualized treatment plan with the patient; Placing medically necessary orders (for medications/tests/procedures/referrals);   This time was independent of any separately billable procedure(s).  The extended duration of this patient visit was medically necessary due to several factors:  The patient's health condition is multifaceted, requiring a comprehensive evaluation of patient and their past records to ensure accurate  diagnosis and treatment planning; Effective patient education and communication, particularly for patients with complex care needs, often require additional time to ensure the patient (or caregivers) fully understand the care plan;  Coordination of care with other healthcare professionals and services depends on thorough documentation, extending both documentation time and visit durations.  All these factors are integral to providing high-quality patient care and ensuring optimal health outcomes.   Additional Info: This encounter employed real-time, collaborative documentation. The patient actively reviewed and updated their medical record on a shared screen, ensuring transparency and facilitating joint problem-solving for the problem list, overview, and plan. This approach promotes accurate, informed care. The treatment plan was discussed and reviewed in detail, including medication safety, potential side effects, and all patient questions. We confirmed understanding and comfort with the plan. Follow-up instructions were established, including contacting the office for any concerns, returning if symptoms worsen, persist, or new symptoms develop, and precautions for potential emergency department visits.

## 2023-05-14 NOTE — Patient Instructions (Signed)
VISIT SUMMARY:  During today's visit, we discussed your ongoing genitourinary symptoms, mental health management, and general health maintenance. We reviewed your current medications and explored potential new treatments for your symptoms. We also discussed strategies for managing your anxiety and attention deficit disorder without medication. Additionally, we talked about the benefits of the Gardasil vaccine for preventing HPV-related diseases.  YOUR PLAN:  -CHRONIC GENITOURINARY PAIN SYNDROME: Chronic Genitourinary Pain Syndrome involves persistent pain and discomfort in the urinary and genital areas. We suspect a non-bacterial cause for your symptoms, possibly related to inflammation or pelvic floor dysfunction. We will continue Celebrex for inflammation, refer you to urology for a cystoscopy, trial Mirabegron for bladder issues, and consider pelvic floor therapy.  -BIPOLAR DISORDER: Bipolar Disorder is a mental health condition characterized by mood swings. Your condition is well-managed with Lamictal, and you will continue this medication. You will also follow up with a bipolar specialist at [facility] in [place].  -ANXIETY: Anxiety is a feeling of worry or fear that can be overwhelming. Your anxiety has increased due to a new relationship but is currently manageable. We discussed non-medication strategies and will monitor your symptoms. If they worsen, we may consult a psychiatrist.  -ATTENTION DEFICIT DISORDER (ADD): Attention Deficit Disorder (ADD) is a condition that affects focus and attention. You tried Adderall but stopped due to mood fluctuations. We discussed the risks of stimulant use in bipolar disorder, and you prefer to avoid medication for ADD at this time.  -GENERAL HEALTH MAINTENANCE: We reviewed your routine labs and discussed the benefits of the Gardasil vaccine for preventing HPV-related diseases. You plan to receive the Gardasil vaccine.  INSTRUCTIONS:  Please follow up  with urology on January 7th for your cystoscopy. Continue taking Celebrex as needed for inflammation. Monitor your anxiety symptoms and consider non-medication strategies. If your anxiety worsens, we may need to consult a psychiatrist. Continue with Lamictal for bipolar disorder and follow up with your specialist at [facility] in [place]. Plan to receive the Gardasil vaccine as discussed.

## 2023-05-14 NOTE — Assessment & Plan Note (Signed)
Bipolar Disorder   His bipolar disorder is well-managed on Lamictal. He experienced mood fluctuations with Adderall, which has been discontinued. He is managing well without additional medications. We will continue Lamictal and he will follow up with a bipolar specialist at [facility] in [place].  Anxiety   His anxiety has increased related to a new relationship, with symptoms fluctuating but currently manageable. Lamictal has been effective in reducing anxiety. We discussed non-pharmacological strategies and the potential need for psychiatric consultation if symptoms worsen. We will monitor anxiety symptoms and consult a psychiatrist if symptoms worsen.

## 2023-05-15 ENCOUNTER — Other Ambulatory Visit (HOSPITAL_BASED_OUTPATIENT_CLINIC_OR_DEPARTMENT_OTHER): Payer: Self-pay

## 2023-06-09 ENCOUNTER — Encounter (INDEPENDENT_AMBULATORY_CARE_PROVIDER_SITE_OTHER): Payer: 59 | Admitting: Internal Medicine

## 2023-06-09 DIAGNOSIS — M0230 Reiter's disease, unspecified site: Secondary | ICD-10-CM

## 2023-06-09 DIAGNOSIS — N342 Other urethritis: Secondary | ICD-10-CM

## 2023-06-09 DIAGNOSIS — Z8261 Family history of arthritis: Secondary | ICD-10-CM

## 2023-06-09 DIAGNOSIS — R69 Illness, unspecified: Secondary | ICD-10-CM

## 2023-06-09 DIAGNOSIS — H579 Unspecified disorder of eye and adnexa: Secondary | ICD-10-CM

## 2023-06-11 ENCOUNTER — Other Ambulatory Visit (HOSPITAL_BASED_OUTPATIENT_CLINIC_OR_DEPARTMENT_OTHER): Payer: Self-pay

## 2023-06-12 DIAGNOSIS — M0230 Reiter's disease, unspecified site: Secondary | ICD-10-CM | POA: Diagnosis not present

## 2023-06-12 NOTE — Telephone Encounter (Signed)
 Physician Documentation of MyChart secure digital messaging clinical encounter  Chief Complaint:  Follow-up regarding chronic urethral/bladder symptoms with request to evaluate possible systemic inflammatory/autoimmune etiology. Patient reports significant improvement with current anti-inflammatory and anticholinergic regimen.  Relevant History: - 33 year old male with chronic genitourinary symptoms - Failed trials: doxycycline , Bactrim  - Significant improvement with Celebrex  and oxybutynin  - Family history significant for maternal rheumatoid arthritis - Associated symptoms: Chronic eye irritation >1 year, right eye predominant - Recent ophthalmology evaluation: external eye irritation, normal retinal exam - Pelvic floor therapy: minimal benefit - Negative workup to date includes:   * Multiple urinalyses (most recent 04/14/23): negative   * STI panel (04/14/23): negative for gonorrhea, chlamydia, trichomonas   * Post-void residual (04/22/23): 4ml   * Previous urology evaluation with cystoscopy recommendation; plan is to hold off now  Objective: Vitals from last visit (05/14/23): - BP: 112/69 - HR: 85 - Weight: 168 lbs 6 oz - BMI: 24.16  Assessment: Patient presents with chronic urogenital inflammation suggesting possible systemic inflammatory condition based on: 1. Family history of autoimmune disease (maternal RA) 2. Concurrent eye symptoms without clear ophthalmologic cause 3. Significant response to systemic anti-inflammatory medication 4. Failure of targeted antimicrobial therapy 5. Normal urological workup to date 6. Pattern of symptoms suggesting multi-system involvement  The decision to defer cystoscopy is reasonable given clinical picture now suggesting systemic rather than isolated urologic pathology.  Plan: 1. Continue current effective medications:    - Celebrex  200mg  BID with monitoring    - Oxybutynin  as prescribed     2. New Orders:    Laboratory Studies:    -  ESR    - CRP    - ANA    - RF    - Anti-CCP   - HLA B27     3. Referrals:    - Rheumatology consultation    - Consider ophthalmology referral     4. Monitoring:    - Regular liver function monitoring for chronic NSAID use    - 29-month follow-up for medication review    - Update problem list with likely chronic inflammatory condition  Medical Decision Making (Moderate Complexity): - Multiple diagnostic options considered - Extensive data review including:   * Multiple previous lab results   * Specialty consultation notes   * Family history impact - Moderate risk due to:   * Chronic condition with systemic symptoms   * Long-term NSAID therapy   * Multiple organ systems involved  Clinical Guidelines Reference: American Urological Association guidelines for CP/CPPS support trial of anti-inflammatory medications and consideration of systemic conditions in treatment-resistant cases. [employer] of Rheumatology guidelines support evaluation of multi-system inflammatory symptoms for possible autoimmune conditions.  Problem List Updates: Add: Chronic inflammatory condition, unspecified, pending rheumatologic evaluation Update: Chronic prostatitis/pelvic pain syndrome - responding to anti-inflammatory therapy  Please see the MyChart message reply(ies) for my assessment and plan.  This patient gave consent for this Medical Advice Message and is aware that it may result in a bill to [employer], as well as the possibility of receiving a bill for a co-payment or deductible. They are an established patient, but are not seeking medical advice exclusively about a problem treated during an in-person or video visit in the last seven days. I did not recommend an in-person or video visit within seven days of my reply.  I spent a total of 25 minutes cumulative time within 7 days through [employer], including: - Review of extensive lab results and  previous notes - Clinical  decision making and research - Documentation and message composition  Bernardino KANDICE Cone, MD

## 2023-06-15 ENCOUNTER — Other Ambulatory Visit (INDEPENDENT_AMBULATORY_CARE_PROVIDER_SITE_OTHER): Payer: 59

## 2023-06-15 DIAGNOSIS — M0230 Reiter's disease, unspecified site: Secondary | ICD-10-CM | POA: Diagnosis not present

## 2023-06-15 DIAGNOSIS — N342 Other urethritis: Secondary | ICD-10-CM | POA: Diagnosis not present

## 2023-06-15 DIAGNOSIS — R69 Illness, unspecified: Secondary | ICD-10-CM | POA: Diagnosis not present

## 2023-06-15 DIAGNOSIS — H579 Unspecified disorder of eye and adnexa: Secondary | ICD-10-CM

## 2023-06-15 DIAGNOSIS — Z8261 Family history of arthritis: Secondary | ICD-10-CM | POA: Diagnosis not present

## 2023-06-16 ENCOUNTER — Ambulatory Visit: Payer: 59 | Admitting: Urology

## 2023-06-16 ENCOUNTER — Other Ambulatory Visit (HOSPITAL_BASED_OUTPATIENT_CLINIC_OR_DEPARTMENT_OTHER): Payer: Self-pay

## 2023-06-16 LAB — SEDIMENTATION RATE: Sed Rate: 11 mm/h (ref 0–15)

## 2023-06-17 ENCOUNTER — Ambulatory Visit: Payer: 59 | Admitting: Urology

## 2023-06-19 ENCOUNTER — Ambulatory Visit (INDEPENDENT_AMBULATORY_CARE_PROVIDER_SITE_OTHER): Payer: 59 | Admitting: Behavioral Health

## 2023-06-19 ENCOUNTER — Encounter: Payer: Self-pay | Admitting: Behavioral Health

## 2023-06-19 DIAGNOSIS — F331 Major depressive disorder, recurrent, moderate: Secondary | ICD-10-CM | POA: Diagnosis not present

## 2023-06-19 DIAGNOSIS — F411 Generalized anxiety disorder: Secondary | ICD-10-CM

## 2023-06-19 LAB — RHEUMATOID FACTOR: Rheumatoid fact SerPl-aCnc: <10 [IU]/mL (ref <14.0)

## 2023-06-19 LAB — ANA W/REFLEX IF POSITIVE: Anti Nuclear Antibody (ANA): NEGATIVE

## 2023-06-19 LAB — SEDIMENTATION RATE: Sed Rate: 2 mm/h (ref 0–15)

## 2023-06-19 LAB — CYCLIC CITRUL PEPTIDE ANTIBODY, IGG/IGA: Cyclic Citrullin Peptide Ab: 6 U (ref 0–19)

## 2023-06-19 LAB — HLA-B27 ANTIGEN: HLA B27: NEGATIVE

## 2023-06-19 LAB — C-REACTIVE PROTEIN: CRP: <1 mg/L (ref 0–10)

## 2023-06-19 NOTE — Progress Notes (Signed)
   Jared Delacruz, Jared Delacruz

## 2023-06-19 NOTE — Progress Notes (Addendum)
 [facility] Counselor Initial Adult Exam  Name: Jared Delacruz Date: 06/19/2023 MRN: [id number] DOB: 1990/11/25 PCP: Jesus Bernardino MATSU, MD  Time spent: 60 minutes, 9 AM until 10 AM This session was held via video teletherapy. The patient consented to the video teletherapy and was located in his home during this session. He is aware it is the responsibility of the patient to secure confidentiality on her end of the session. The provider was in a private home office for the duration of this session.    The patient arrived on time for her Caregility session   Guardian/Payee: Self  Paperwork requested: No   Reason for Visit /Presenting Problem: Anxiety, depression  Little is a 33 year old single male who presents with symptoms of anxiety and depression.  He currently lives alone.  He has been in therapy in the past when he was in his early 14s to mid 13s but has not had a medication management until the past 2 years.  He is currently on Lamictal  which she says was almost instantly beneficial in stabilization of mood.  He started that in January 2023 and is on 150 mg.  He reports that his mood is brighter with minimal side effects from medication.  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  [place] with his biological parents as well as a brother who is 5 years older and a sister who is 5 years younger.  He reports a fair relationship with his parents that he said his father was an art gallery manager and provided well for them as mother was a stay-at-home mom.  She reports that both of his parents were [employer] Witnesses so he has some religious trauma growing up in that environment.  He reports that his mother had a lot of unmet needs as a child and therefore she was not necessarily able to be a good mother and was not necessarily  emotionally stable.  Patient's father died when he was 33 years old and said that was a traumatic period for him also.  He says he still has a fairly good relationship with her mother and talks to her often but there is an emotional chasm in the relationship.  Growing up his brother was not very good to him and said he did not have a very good relationship.  In the past couple of years that has gotten better but since his brother can be very emotionally abusive and has a tendency to want to take charge.  His relationship with sister was better when he was younger but his sister changed when their father died.  She got married when she was 33 years old and is still in a good marriage in the area but said that relationship with her also change when her father died.  He also would not she is part of that and not recognizing some of his mood instability and how that contributes negatively to the relationship with his sister.  That has improved since starting on Lamictal .  He did not go into details but reports it was religious trauma from [facility] Witnesses.  Since that time he started going to a [employer] saying it supportive and he has gone to Bible studies there which have been beneficial to him.  He also started dating someone about 3 months ago  and said it is the healthiest relationship that he has been in.  He reports that he started having anxiety and depression when he was around 33 years old and in his late teens or early 24s was taking Adderall but was abusing that.  He started drinking alcohol casually at about 33 years old.  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  He said that he needed to leave [place]  after his father's death and to run away from the [facility] Witness experience.  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  He also met another male out there who in reducing tobacco which she had not used either up.  He said he started using LSD, mushrooms and molly but did not take molly very long because it was a stimulant he does not do well with that.  He reports at that time he was using a lot of marijuana.  This is around 2018 81 moved back to [place]  in 2018 but still has marijuana LSD and it as said to have this.  He said he was taking he has said in micro doses but then sought increase in his use of alcohol.  He reported that around 2021 things went really bad although he did stop the acid and mushrooms he felt disconnected and that his life fell chaotic.  In 2021 he began having more severe depressive episodes.  He said he finally Reolysin and when he was doing to his life around age 23 he started into a partial recovery which lasted until 61 but it felt a 30 through currently he has begin a more extensive recovery.  He said taking her Lamictal  and stabilize moods we does not necessarily need anything else.  He experiences anxiety as feeling nervous in the stomach and feeling somewhat body dysmorphic.  He also says that especially around age 42 there was significant rumination and got into a negative thought loop.  He said depression started in his teens and it was pretty dark times with some passive suicidal ideation but no active suicidal ideation and no plan.  He said he tried Wellbutrin and felt like that put him into a black hole and he came off of that pretty quickly.  He currently rates his depression as about a 3 on a scale of 10 and his anxiety is about a 6 on a scale of 10.  He reports no desire for self-harm now and does contract for safety.  Medications currently managed through his primary care physician.  He did see [facility] back in 2023 but says it was not a very good placement.  He indicates that he plans to reach back out to see  if he can be connected with him again for medication evaluation.  He is still not sure if diagnosis saying that everything from bipolar 1 to bipolar 2, to PTSD to ADD.  Goals will be to work on using his anxiety and depression continuing to learn how to manage anxious or negative thoughts.  Mental Status Exam: Appearance:   Well Groomed     Behavior:  Appropriate  Motor:  Normal  Speech/Language:   Normal Rate  Affect:  Appropriate  Mood:  normal  Thought process:  normal  Thought content:    WNL  Sensory/Perceptual disturbances:    WNL  Orientation:  oriented to person, place, time/date, situation, day of week, month of year, and year  Attention:  Good  Concentration:  Good  Memory:  WNL  Fund of knowledge:   Good  Insight:    Good  Judgment:   Good  Impulse Control:  Good    Reported Symptoms: Anxiety, depression  Risk Assessment: Danger to Self:  No Self-injurious Behavior: No Danger to Others: No Duty to Warn:no Physical Aggression / Violence:No  Access to Firearms a concern: No  Gang Involvement:No  Patient / guardian was educated about steps to take if suicide or homicide risk level increases between visits: n/a While future psychiatric events cannot be accurately predicted, the patient does not currently require acute inpatient psychiatric care and does not currently meet [facility]  involuntary commitment criteria.  Substance Abuse History: Current substance abuse: No     Past Psychiatric History:   Previous psychological history is significant for ADHD, anxiety, and depression Outpatient Providers: Primary care physician History of Psych Hospitalization: No  Psychological Testing:  n/a    Abuse History:  Victim of: No.,  None reported    Report needed: No. Victim of Neglect:No. Perpetrator of none reported  Witness / Exposure to Domestic Violence: None reported  Protective Services Involvement: No  Witness to [employer] Violence:  No   Family History:   Family History  Problem Relation Age of Onset   Rheum arthritis Mother    Anxiety disorder Mother    Cancer Father        salivary   ADD / ADHD Maternal Grandmother    Depression Maternal Aunt    Alcohol abuse Maternal Uncle     Living situation: the patient lives alone  Sexual Orientation: Straight  Relationship Status: single  Name of spouse / other: If a parent, number of children / ages:n/a  Support Systems: significant other friends  Surveyor, Quantity Stress:  No   Income/Employment/Disability: Employment  Financial Planner: No   Educational History: Education: Risk Manager: Protestant  Any cultural differences that may affect / interfere with treatment:  not applicable   Recreation/Hobbies: Girlfriend  Stressors: Traumatic event    Strengths: Supportive Relationships and Spirituality  Barriers:     Legal History: Pending legal issue / charges: The patient has no significant history of legal issues. History of legal issue / charges:  none reported  Medical History/Surgical History: reviewed Past Medical History:  Diagnosis Date   ADHD    Anxiety    Bipolar 1 disorder (HCC)    Depression    Encounter for annual physical exam 07/08/2022   Substance abuse (HCC)    Alcohol, self-medication with mood modulating substances    History reviewed. No pertinent surgical history.  Medications: Current Outpatient Medications  Medication Sig Dispense Refill   celecoxib  (CELEBREX ) 200 MG capsule Take 1 capsule (200 mg total) by mouth 2 (two) times daily. 60 capsule 2   lamoTRIgine  (LAMICTAL ) 100 MG tablet Take 0.5 tablets (50 mg total) by mouth daily. (Patient taking differently: Take 150 mg by mouth daily.) 30 tablet 1   mirabegron  ER (MYRBETRIQ ) 25 MG TB24 tablet Take 1 tablet (25 mg total) by mouth daily. 10 tablet 0   oxybutynin  (DITROPAN ) 5 MG tablet Take 1 tablet (5 mg total) by mouth 3 (three) times daily. 30 tablet 11    No current facility-administered medications for this visit.    Allergies  Allergen Reactions   Banana Rash  Doxycycline  Other (See Comments)    Throat felt weird, eyes were red, felt tired and sick.    Shellfish Allergy     Diagnoses: Generalized anxiety disorder, major depressive disorder, recurrent, moderate.  The patient has been diagnosed with bipolar warning about 2 months past open like to clarify it.   Plan of Care: I will meet with the patient every 2 weeks   Lorrene CHRISTELLA Hasten, [facility]

## 2023-06-24 ENCOUNTER — Other Ambulatory Visit (HOSPITAL_BASED_OUTPATIENT_CLINIC_OR_DEPARTMENT_OTHER): Payer: Self-pay

## 2023-07-02 DIAGNOSIS — F902 Attention-deficit hyperactivity disorder, combined type: Secondary | ICD-10-CM | POA: Diagnosis not present

## 2023-07-02 DIAGNOSIS — F411 Generalized anxiety disorder: Secondary | ICD-10-CM | POA: Diagnosis not present

## 2023-07-02 DIAGNOSIS — F319 Bipolar disorder, unspecified: Secondary | ICD-10-CM | POA: Diagnosis not present

## 2023-07-03 ENCOUNTER — Ambulatory Visit: Payer: 59 | Admitting: Cardiology

## 2023-07-10 ENCOUNTER — Ambulatory Visit: Payer: 59 | Admitting: Behavioral Health

## 2023-07-10 ENCOUNTER — Ambulatory Visit: Payer: 59 | Admitting: Cardiology

## 2023-07-22 ENCOUNTER — Other Ambulatory Visit (HOSPITAL_BASED_OUTPATIENT_CLINIC_OR_DEPARTMENT_OTHER): Payer: Self-pay

## 2023-07-22 ENCOUNTER — Ambulatory Visit: Payer: 59 | Admitting: Behavioral Health

## 2023-08-07 ENCOUNTER — Ambulatory Visit: Payer: 59 | Admitting: Behavioral Health

## 2023-08-27 DIAGNOSIS — F411 Generalized anxiety disorder: Secondary | ICD-10-CM | POA: Diagnosis not present

## 2023-08-27 DIAGNOSIS — F319 Bipolar disorder, unspecified: Secondary | ICD-10-CM | POA: Diagnosis not present

## 2023-08-27 DIAGNOSIS — F902 Attention-deficit hyperactivity disorder, combined type: Secondary | ICD-10-CM | POA: Diagnosis not present

## 2023-10-22 ENCOUNTER — Other Ambulatory Visit (HOSPITAL_BASED_OUTPATIENT_CLINIC_OR_DEPARTMENT_OTHER): Payer: Self-pay

## 2023-10-22 ENCOUNTER — Other Ambulatory Visit: Payer: Self-pay | Admitting: Internal Medicine

## 2023-10-22 DIAGNOSIS — N3281 Overactive bladder: Secondary | ICD-10-CM

## 2023-10-22 DIAGNOSIS — G894 Chronic pain syndrome: Secondary | ICD-10-CM

## 2023-10-22 MED ORDER — CELECOXIB 200 MG PO CAPS
200.0000 mg | ORAL_CAPSULE | Freq: Two times a day (BID) | ORAL | 2 refills | Status: AC
  Filled 2023-10-22: qty 60, 30d supply, fill #0

## 2023-11-03 ENCOUNTER — Other Ambulatory Visit (HOSPITAL_BASED_OUTPATIENT_CLINIC_OR_DEPARTMENT_OTHER): Payer: Self-pay

## 2023-11-19 DIAGNOSIS — F411 Generalized anxiety disorder: Secondary | ICD-10-CM | POA: Diagnosis not present

## 2023-11-19 DIAGNOSIS — F902 Attention-deficit hyperactivity disorder, combined type: Secondary | ICD-10-CM | POA: Diagnosis not present

## 2023-11-19 DIAGNOSIS — F319 Bipolar disorder, unspecified: Secondary | ICD-10-CM | POA: Diagnosis not present

## 2023-12-01 ENCOUNTER — Telehealth: Payer: Self-pay

## 2023-12-01 ENCOUNTER — Encounter: Payer: Self-pay | Admitting: Internal Medicine

## 2023-12-01 ENCOUNTER — Ambulatory Visit (INDEPENDENT_AMBULATORY_CARE_PROVIDER_SITE_OTHER): Admitting: Internal Medicine

## 2023-12-01 ENCOUNTER — Ambulatory Visit: Payer: Self-pay | Admitting: Internal Medicine

## 2023-12-01 VITALS — BP 126/68 | HR 95 | Temp 98.0°F | Ht 70.0 in | Wt 171.4 lb

## 2023-12-01 DIAGNOSIS — R7303 Prediabetes: Secondary | ICD-10-CM | POA: Insufficient documentation

## 2023-12-01 DIAGNOSIS — M1990 Unspecified osteoarthritis, unspecified site: Secondary | ICD-10-CM

## 2023-12-01 DIAGNOSIS — R8290 Unspecified abnormal findings in urine: Secondary | ICD-10-CM | POA: Diagnosis not present

## 2023-12-01 DIAGNOSIS — Z8261 Family history of arthritis: Secondary | ICD-10-CM

## 2023-12-01 DIAGNOSIS — Z833 Family history of diabetes mellitus: Secondary | ICD-10-CM | POA: Diagnosis not present

## 2023-12-01 DIAGNOSIS — Z23 Encounter for immunization: Secondary | ICD-10-CM | POA: Diagnosis not present

## 2023-12-01 LAB — POCT GLYCOSYLATED HEMOGLOBIN (HGB A1C): Hemoglobin A1C: 5.8 % — AB (ref 4.0–5.6)

## 2023-12-01 NOTE — Assessment & Plan Note (Signed)
Updated problem overview for this problem to improve longitudinal management  

## 2023-12-01 NOTE — Assessment & Plan Note (Signed)
 Will check Hemoglobin A1c- found he has prediabetes.

## 2023-12-01 NOTE — Telephone Encounter (Signed)
 Copied from CRM [id number]. Topic: Referral - Status >> Dec 01, 2023  9:34 AM Ernestene P wrote: Reason for CRM: Pt called to advise the rheumatology he would like to go to [facility] [address], [place], [place] [address] as it's in network with Insurance.

## 2023-12-01 NOTE — Assessment & Plan Note (Signed)
 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 Previous tests in January were negative for ANA, CCP, HLA-B27, and rheumatoid factor. His family history is significant for rheumatoid arthritis, with his mother experiencing a similar course of initially negative factors. Inflammatory arthritis is suspected given his symptoms and family history. He prefers to follow up with a rheumatologist in [place] due to an upcoming relocation. Refer to a rheumatologist in [place] for further evaluation and management. Hold off on x-rays to limit radiation exposure, as there is no urgency.

## 2023-12-01 NOTE — Progress Notes (Signed)
 ==============================  [facility]: [phone]   -- Medical Office Visit --  Patient: Jared Delacruz      Age: 33 y.o.       Sex:  male  Date:   12/01/2023 Today's Healthcare Provider: Bernardino KANDICE Cone, MD  ==============================   Chief Complaint: Joint Pain (Pt states he has been having joint pain all over but now its a lot in neck and back. Never has had xrays of anything. Would like to get his HPV third vaccine as well today.Mom has tested positive for RA factor and scans done.)  Discussed the use of AI scribe software for clinical note transcription with the patient, who gave verbal consent to proceed.  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  He has been experiencing increasing joint aches for several months, with symptoms migrating between different joints. Initially, soreness was noted in the ankles, persisting with activities like plyometrics. The knees and right thumb were also affected, but currently, the neck is the most painful area, having been sore for about two to three months. He has been taking NSAIDs and turmeric to manage the symptoms.  There is a family history of rheumatoid arthritis; his mother has tested positive for rheumatoid factors and has had positive bone scans. He notes that his mother had a similar course where factors were initially negative before turning positive. His own tests in January, including rheumatoid factor, ANA, CCP, and HLA-B27, were negative.  He mentions having blood sugar issues, which he noticed when his urine appeared sweet, cloudy, and yellow. These symptoms are associated with his diet, particularly after consuming sugar. He has been managing these symptoms with a low-carb, low-sugar diet, which has improved his urinary symptoms and reduced bladder and urethral discomfort. There is a family history of  blood sugar issues, with relatives being prediabetic and managing their condition through diet.  This SmartLink has not been configured with any valid records.   Lab Results  Component Value Date   HGBA1C 5.8 (A) 12/01/2023     Updated Problem List Entries: Problem  Family History of Diabetes Mellitus Type II  Family History of Rheumatoid Arthritis  Undifferentiated Inflammatory Arthritis  Prediabetes   A1c was 5.8 on December 01, 2023     Background Reviewed: Problem List: has Male pattern baldness; ADD (attention deficit disorder); Anxiety; Moderate episode of recurrent major depressive disorder (HCC); Protrusion of lumbar intervertebral disc; Bipolar 1 disorder (HCC); Family history of diabetes mellitus type II; Family history of rheumatoid arthritis; Undifferentiated inflammatory arthritis; and Prediabetes on their problem list. Past Medical History:  has a past medical history of ADHD, Anxiety, Bipolar 1 disorder (HCC), Depression, Encounter for annual physical exam (07/08/2022), Substance abuse (HCC), and Undifferentiated inflammatory arthritis (12/01/2023). Past Surgical History:   has no past surgical history on file. Social History:   reports that he has never smoked. He has never used smokeless tobacco. He reports that he does not currently use alcohol after a past usage of about 1.0 standard drink of alcohol per week. He reports that he does not use drugs. Family History:  family history includes ADD / ADHD in his maternal grandmother; Alcohol abuse in his maternal uncle; Anxiety disorder in his mother; Cancer in his father; Depression in his maternal aunt; Rheum arthritis in his mother. Allergies:  is allergic to banana, doxycycline , and shellfish allergy.   Medication Reconciliation: Current Outpatient Medications on  File Prior to Visit  Medication Sig   celecoxib  (CELEBREX ) 200 MG capsule Take 1 capsule (200 mg total) by mouth 2 (two) times daily.   oxybutynin  (DITROPAN ) 5 MG  tablet Take 1 tablet (5 mg total) by mouth 3 (three) times daily. (Patient taking differently: Take 5 mg by mouth 3 (three) times daily. PRN)   lamoTRIgine  (LAMICTAL ) 100 MG tablet Take 0.5 tablets (50 mg total) by mouth daily. (Patient taking differently: Take 150 mg by mouth daily.)   mirabegron  ER (MYRBETRIQ ) 25 MG TB24 tablet Take 1 tablet (25 mg total) by mouth daily.   No current facility-administered medications on file prior to visit.  There are no discontinued medications.   Physical Exam:    12/01/2023    8:39 AM 05/14/2023    9:00 AM 04/22/2023    2:35 PM  Vitals with BMI  Height 5' 10 5' 10 5' 10  Weight 171 lbs 6 oz 168 lbs 6 oz 167 lbs  BMI 24.59 24.16 23.96  Systolic 126 112 855  Diastolic 68 69 78  Pulse 95 85 89  Vital signs reviewed.  Nursing notes reviewed. Weight trend reviewed. Physical Exam General Appearance:  No acute distress appreciable.   Well-groomed, healthy-appearing male.  Well proportioned with no abnormal fat distribution.  Good muscle tone. Pulmonary:  Normal work of breathing at rest, no respiratory distress apparent. SpO2: 98 %  Musculoskeletal: All extremities are intact.  Neurological:  Awake, alert, oriented, and engaged.  No obvious focal neurological deficits or cognitive impairments.  Sensorium seems unclouded.   Speech is clear and coherent with logical content. Psychiatric:  Appropriate mood, pleasant and cooperative demeanor, thoughtful and engaged during the exam  Results:    12/01/2023    8:47 AM 05/14/2023    9:08 AM 04/14/2023   10:32 AM 07/08/2022    8:41 AM  PHQ 2/9 Scores  PHQ - 2 Score 0 1 2 3   PHQ- 9 Score 0 9 11 10    Results LABS HPV: positive Rheumatoid factor: negative (06/2023) ANA: negative (06/2023) CCP: negative (06/2023) HLA-B27: negative (06/2023) Chlamydia: negative (06/2023) Gonorrhea: negative (06/2023)    Results for orders placed or performed in visit on 12/01/23  POCT HgB A1C  Result Value Ref Range    Hemoglobin A1C 5.8 (A) 4.0 - 5.6 %   HbA1c POC (<> result, manual entry)     HbA1c, POC (prediabetic range)     HbA1c, POC (controlled diabetic range)     Office Visit on 12/01/2023  Component Date Value Ref Range Status   Hemoglobin A1C 12/01/2023 5.8 (A)  4.0 - 5.6 % Final  Lab on 06/15/2023  Component Date Value Ref Range Status   Cyclic Citrullin Peptide Ab 98/93/7974 6  0 - 19 units Final   Rheumatoid fact SerPl-aCnc 06/15/2023 <10.0  <14.0 IU/mL Final   Anti Nuclear Antibody (ANA) 06/15/2023 Negative  Negative Final   CRP 06/15/2023 <1  0 - 10 mg/L Final   HLA B27 06/15/2023 Negative   Final   Sed Rate 06/15/2023 11  0 - 15 mm/hr Final   Sed Rate 06/15/2023 2  0 - 15 mm/hr Final  Office Visit on 04/22/2023  Component Date Value Ref Range Status   Scan Result 04/22/2023 4ml   Final  Office Visit on 04/14/2023  Component Date Value Ref Range Status   RPR Ser Ql 04/14/2023 NON-REACTIVE  NON-REACTIVE Final   Neisseria Gonorrhea 04/14/2023 Negative   Final   Chlamydia 04/14/2023 Negative  Final   Trichomonas 04/14/2023 Negative   Final   Comment 04/14/2023 Normal Reference Range Trichomonas - Negative   Final   Comment 04/14/2023 Normal Reference Ranger Chlamydia - Negative   Final   Comment 04/14/2023 Normal Reference Range Neisseria Gonorrhea - Negative   Final   HSV 1 IGG,TYPE SPECIFIC AB 04/14/2023 <0.90  index Final   HSV 2 IGG,TYPE SPECIFIC AB 04/14/2023 <0.90  index Final   HIV 1&2 Ab, 4th Generation 04/14/2023 NON-REACTIVE  NON-REACTIVE Final   Hepatitis C Ab 04/14/2023 NON-REACTIVE  NON-REACTIVE Final   Color, Urine 04/14/2023 Lt. Yellow  Yellow;Lt. Yellow;Straw;Dark Yellow;Amber;Green;Red;Brown Final   APPearance 04/14/2023 CLEAR  Clear;Turbid;Slightly Cloudy;Cloudy Final   Specific Gravity, Urine 04/14/2023 1.010  1.000 - 1.030 Final   pH 04/14/2023 7.5  5.0 - 8.0 Final   Total Protein, Urine 04/14/2023 NEGATIVE  Negative Final   Urine Glucose 04/14/2023 NEGATIVE   Negative Final   Ketones, ur 04/14/2023 NEGATIVE  Negative Final   Bilirubin Urine 04/14/2023 NEGATIVE  Negative Final   Hgb urine dipstick 04/14/2023 NEGATIVE  Negative Final   Urobilinogen, UA 04/14/2023 0.2  0.0 - 1.0 Final   Leukocytes,Ua 04/14/2023 NEGATIVE  Negative Final   Nitrite 04/14/2023 NEGATIVE  Negative Final   WBC, UA 04/14/2023 none seen  0-2/hpf Final   RBC / HPF 04/14/2023 none seen  0-2/hpf Final   Neisseria Gonorrhea 04/14/2023 Negative   Final   Chlamydia 04/14/2023 Negative   Final   Trichomonas 04/14/2023 Negative   Final   Comment 04/14/2023 Normal Reference Range Trichomonas - Negative   Final   Comment 04/14/2023 Normal Reference Ranger Chlamydia - Negative   Final   Comment 04/14/2023 Normal Reference Range Neisseria Gonorrhea - Negative   Final  Office Visit on 03/17/2023  Component Date Value Ref Range Status   Specific Gravity, UA 03/17/2023 1.015  1.005 - 1.030 Final   pH, UA 03/17/2023 6.5  5.0 - 7.5 Final   Color, UA 03/17/2023 Yellow  Yellow Final   Appearance Ur 03/17/2023 Clear  Clear Final   Leukocytes,UA 03/17/2023 Negative  Negative Final   Protein,UA 03/17/2023 Negative  Negative/Trace Final   Glucose, UA 03/17/2023 Negative  Negative Final   Ketones, UA 03/17/2023 Negative  Negative Final   RBC, UA 03/17/2023 Negative  Negative Final   Bilirubin, UA 03/17/2023 Negative  Negative Final   Urobilinogen, Ur 03/17/2023 0.2  0.2 - 1.0 mg/dL Final   Nitrite, UA 89/91/7975 Negative  Negative Final   Microscopic Examination 03/17/2023 Comment   Final   Scan Result 03/17/2023   Final  Admission on 07/27/2022, Discharged on 07/28/2022  Component Date Value Ref Range Status   WBC 07/27/2022 5.3  4.0 - 10.5 K/uL Final   RBC 07/27/2022 4.60  4.22 - 5.81 MIL/uL Final   Hemoglobin 07/27/2022 13.9  13.0 - 17.0 g/dL Final   HCT 97/81/7975 39.6  39.0 - 52.0 % Final   MCV 07/27/2022 86.1  80.0 - 100.0 fL Final   MCH 07/27/2022 30.2  26.0 - 34.0 pg  Final   MCHC 07/27/2022 35.1  30.0 - 36.0 g/dL Final   RDW 97/81/7975 12.2  11.5 - 15.5 % Final   Platelets 07/27/2022 179  150 - 400 K/uL Final   nRBC 07/27/2022 0.0  0.0 - 0.2 % Final   Neutrophils Relative % 07/27/2022 61  % Final   Neutro Abs 07/27/2022 3.3  1.7 - 7.7 K/uL Final   Lymphocytes Relative 07/27/2022 28  % Final  Lymphs Abs 07/27/2022 1.5  0.7 - 4.0 K/uL Final   Monocytes Relative 07/27/2022 9  % Final   Monocytes Absolute 07/27/2022 0.5  0.1 - 1.0 K/uL Final   Eosinophils Relative 07/27/2022 1  % Final   Eosinophils Absolute 07/27/2022 0.1  0.0 - 0.5 K/uL Final   Basophils Relative 07/27/2022 1  % Final   Basophils Absolute 07/27/2022 0.0  0.0 - 0.1 K/uL Final   Immature Granulocytes 07/27/2022 0  % Final   Abs Immature Granulocytes 07/27/2022 0.01  0.00 - 0.07 K/uL Final   Sodium 07/27/2022 142  135 - 145 mmol/L Final   Potassium 07/27/2022 3.9  3.5 - 5.1 mmol/L Final   Chloride 07/27/2022 103  98 - 111 mmol/L Final   CO2 07/27/2022 30  22 - 32 mmol/L Final   Glucose, Bld 07/27/2022 129 (H)  70 - 99 mg/dL Final   BUN 97/81/7975 18  6 - 20 mg/dL Final   Creatinine, Ser 07/27/2022 1.21  0.61 - 1.24 mg/dL Final   Calcium 97/81/7975 10.2  8.9 - 10.3 mg/dL Final   Total Protein 97/81/7975 7.5  6.5 - 8.1 g/dL Final   Albumin 97/81/7975 5.1 (H)  3.5 - 5.0 g/dL Final   AST 97/81/7975 22  15 - 41 U/L Final   ALT 07/27/2022 17  0 - 44 U/L Final   Alkaline Phosphatase 07/27/2022 51  38 - 126 U/L Final   Total Bilirubin 07/27/2022 0.4  0.3 - 1.2 mg/dL Final   GFR, Estimated 07/27/2022 >60  >60 mL/min Final   Anion gap 07/27/2022 9  5 - 15 Final   Magnesium 07/27/2022 2.1  1.7 - 2.4 mg/dL Final   Total CK 97/81/7975 189  49 - 397 U/L Final   Color, Urine 07/27/2022 COLORLESS (A)  YELLOW Final   APPearance 07/27/2022 CLEAR  CLEAR Final   Specific Gravity, Urine 07/27/2022 1.009  1.005 - 1.030 Final   pH 07/27/2022 5.5  5.0 - 8.0 Final   Glucose, UA 07/27/2022 NEGATIVE   NEGATIVE mg/dL Final   Hgb urine dipstick 07/27/2022 NEGATIVE  NEGATIVE Final   Bilirubin Urine 07/27/2022 NEGATIVE  NEGATIVE Final   Ketones, ur 07/27/2022 NEGATIVE  NEGATIVE mg/dL Final   Protein, ur 97/81/7975 NEGATIVE  NEGATIVE mg/dL Final   Nitrite 97/81/7975 NEGATIVE  NEGATIVE Final   Leukocytes,Ua 07/27/2022 NEGATIVE  NEGATIVE Final  Office Visit on 07/08/2022  Component Date Value Ref Range Status   WBC 07/08/2022 2.9 (L)  4.0 - 10.5 K/uL Final   RBC 07/08/2022 4.69  4.22 - 5.81 Mil/uL Final   Hemoglobin 07/08/2022 14.1  13.0 - 17.0 g/dL Final   HCT 98/69/7975 40.9  39.0 - 52.0 % Final   MCV 07/08/2022 87.4  78.0 - 100.0 fl Final   MCHC 07/08/2022 34.3  30.0 - 36.0 g/dL Final   RDW 98/69/7975 13.1  11.5 - 15.5 % Final   Platelets 07/08/2022 192.0  150.0 - 400.0 K/uL Final   Neutrophils Relative % 07/08/2022 64.0  43.0 - 77.0 % Final   Lymphocytes Relative 07/08/2022 26.5  12.0 - 46.0 % Final   Monocytes Relative 07/08/2022 8.2  3.0 - 12.0 % Final   Eosinophils Relative 07/08/2022 0.9  0.0 - 5.0 % Final   Basophils Relative 07/08/2022 0.4  0.0 - 3.0 % Final   Neutro Abs 07/08/2022 1.8  1.4 - 7.7 K/uL Final   Lymphs Abs 07/08/2022 0.8  0.7 - 4.0 K/uL Final   Monocytes Absolute 07/08/2022 0.2  0.1 -  1.0 K/uL Final   Eosinophils Absolute 07/08/2022 0.0  0.0 - 0.7 K/uL Final   Basophils Absolute 07/08/2022 0.0  0.0 - 0.1 K/uL Final   Sodium 07/08/2022 141  135 - 145 mEq/L Final   Potassium 07/08/2022 4.0  3.5 - 5.1 mEq/L Final   Chloride 07/08/2022 103  96 - 112 mEq/L Final   CO2 07/08/2022 30  19 - 32 mEq/L Final   Glucose, Bld 07/08/2022 102 (H)  70 - 99 mg/dL Final   BUN 98/69/7975 13  6 - 23 mg/dL Final   Creatinine, Ser 07/08/2022 0.95  0.40 - 1.50 mg/dL Final   Total Bilirubin 07/08/2022 0.5  0.2 - 1.2 mg/dL Final   Alkaline Phosphatase 07/08/2022 57  39 - 117 U/L Final   AST 07/08/2022 21  0 - 37 U/L Final   ALT 07/08/2022 18  0 - 53 U/L Final   Total Protein  07/08/2022 7.5  6.0 - 8.3 g/dL Final   Albumin 98/69/7975 5.0  3.5 - 5.2 g/dL Final   GFR 98/69/7975 107.38  >60.00 mL/min Final   Calcium 07/08/2022 9.9  8.4 - 10.5 mg/dL Final   Cholesterol 98/69/7975 260 (H)  0 - 200 mg/dL Final   Triglycerides 98/69/7975 115.0  0.0 - 149.0 mg/dL Final   HDL 98/69/7975 65.40  >39.00 mg/dL Final   VLDL 98/69/7975 23.0  0.0 - 40.0 mg/dL Final   LDL Cholesterol 07/08/2022 171 (H)  0 - 99 mg/dL Final   Total CHOL/HDL Ratio 07/08/2022 4   Final   NonHDL 07/08/2022 194.27   Final   TSH 07/08/2022 1.72  0.35 - 5.50 uIU/mL Final  No image results found. No results found.      Assessment & Plan Undifferentiated inflammatory arthritis He reports migratory joint pain affecting his ankles, knees, right thumb, and cervical spine, with the most severe pain in his neck for the past 2-3 months. Previous tests in January were negative for ANA, CCP, HLA-B27, and rheumatoid factor. His family history is significant for rheumatoid arthritis, with his mother experiencing a similar course of initially negative factors. Inflammatory arthritis is suspected given his symptoms and family history. He prefers to follow up with a rheumatologist in [place] due to an upcoming relocation. Refer to a rheumatologist in [place] for further evaluation and management. Hold off on x-rays to limit radiation exposure, as there is no urgency. Immunization due The importance of HPV vaccination in preventing cervical and other cancers was discussed. Informed consent included the vaccine's role in reducing cancer incidence, with reference to decreased cervical cancer cases due to Gardasil. He is in a monogamous relationship and has chosen to get vaccinated. Family history of rheumatoid arthritis Updated problem overview for this problem to improve longitudinal management Family history of diabetes mellitus type II Will check Hemoglobin A1c- found he has prediabetes. Abnormal urine He reports  sweet-smelling, yellow, and dark urine, particularly after sugar intake, associated with blood sugar issues, though he has no formal diabetes diagnosis. His family history includes diet-controlled type 2 diabetes. He prefers dietary management similar to family members who control blood sugar through low-carb diets. Perform a finger stick A1c to assess blood sugar control.       Orders Placed in Encounter:   Lab Orders         POCT HgB A1C     Orders Placed This Encounter  Procedures   HPV 9-valent vaccine,Recombinat   Ambulatory referral to Rheumatology    Referral Priority:   Routine  Referral Type:   Consultation    Referral Reason:   Specialty Services Required    Requested Specialty:   Rheumatology    Number of Visits Requested:   1   POCT HgB A1C        This document was synthesized by artificial intelligence (Abridge) using HIPAA-compliant recording of the clinical interaction;   We discussed the use of AI scribe software for clinical note transcription with the patient, who gave verbal consent to proceed. additional Info: This encounter employed state-of-the-art, real-time, collaborative documentation. The patient actively reviewed and assisted in updating their electronic medical record on a shared screen, ensuring transparency and facilitating joint problem-solving for the problem list, overview, and plan. This approach promotes accurate, informed care. The treatment plan was discussed and reviewed in detail, including medication safety, potential side effects, and all patient questions. We confirmed understanding and comfort with the plan. Follow-up instructions were established, including contacting the office for any concerns, returning if symptoms worsen, persist, or new symptoms develop, and precautions for potential emergency department visits.

## 2023-12-03 NOTE — Addendum Note (Signed)
 Addended by: KEARNEY KOLEEN RAMAN on: 12/03/2023 03:03 PM   Modules accepted: Orders

## 2024-01-01 ENCOUNTER — Telehealth

## 2024-01-01 DIAGNOSIS — S70261A Insect bite (nonvenomous), right hip, initial encounter: Secondary | ICD-10-CM | POA: Diagnosis not present

## 2024-01-01 DIAGNOSIS — W57XXXA Bitten or stung by nonvenomous insect and other nonvenomous arthropods, initial encounter: Secondary | ICD-10-CM

## 2024-01-01 DIAGNOSIS — Z9189 Other specified personal risk factors, not elsewhere classified: Secondary | ICD-10-CM | POA: Diagnosis not present

## 2024-01-01 NOTE — Progress Notes (Signed)
 Virtual Visit Consent   Jared Delacruz, you are scheduled for a virtual visit with a [facility] provider today. Just as with appointments in the office, your consent must be obtained to participate. Your consent will be active for this visit and any virtual visit you may have with one of our providers in the next 365 days. If you have a MyChart account, a copy of this consent can be sent to you electronically.  As this is a virtual visit, video technology does not allow for your provider to perform a traditional examination. This may limit your provider's ability to fully assess your condition. If your provider identifies any concerns that need to be evaluated in person or the need to arrange testing (such as labs, EKG, etc.), we will make arrangements to do so. Although advances in technology are sophisticated, we cannot ensure that it will always work on either your end or our end. If the connection with a video visit is poor, the visit may have to be switched to a telephone visit. With either a video or telephone visit, we are not always able to ensure that we have a secure connection.  By engaging in this virtual visit, you consent to the provision of healthcare and authorize for your insurance to be billed (if applicable) for the services provided during this visit. Depending on your insurance coverage, you may receive a charge related to this service.  I need to obtain your verbal consent now. Are you willing to proceed with your visit today? [name] has provided verbal consent on 01/01/2024 for a virtual visit (video or telephone). Delon CHRISTELLA Dickinson, PA-C  Date: 01/01/2024 8:44 AM   Virtual Visit via Video Note   I, Delon CHRISTELLA Dickinson, connected with  Jared Delacruz  ([id number], 1991-06-05) on 01/01/24 at  8:00 AM EDT by a video-enabled telemedicine application and verified that I am speaking with the correct person using two identifiers.  Location: Patient: Virtual Visit Location  Patient: Home Provider: Virtual Visit Location Provider: Home Office   I discussed the limitations of evaluation and management by telemedicine and the availability of in person appointments. The patient expressed understanding and agreed to proceed.    History of Present Illness: Jared Delacruz is a 33 y.o. who identifies as a male who was assigned male at birth, and is being seen today for tick bite. Unsure of duration attached since it was a nymph, but not engorged. It was found on the right hip last night. There is a small red bump where tick was attached. Denies any fevers, chills, nausea, vomiting, body aches, muscle aches, headache, or rashes.    Problems:  Patient Active Problem List   Diagnosis Date Noted   Family history of diabetes mellitus type II 12/01/2023   Family history of rheumatoid arthritis 12/01/2023   Undifferentiated inflammatory arthritis 12/01/2023   Prediabetes 12/01/2023   Bipolar 1 disorder (HCC) 01/30/2022   Protrusion of lumbar intervertebral disc 12/02/2017   Anxiety 05/22/2015   Moderate episode of recurrent major depressive disorder (HCC) 05/22/2015   Male pattern baldness 02/08/2015   ADD (attention deficit disorder) 02/08/2015    Allergies:  Allergies  Allergen Reactions   Banana Rash   Doxycycline  Other (See Comments)    Throat felt weird, eyes were red, felt tired and sick.    Shellfish Allergy    Medications:  Current Outpatient Medications:    celecoxib  (CELEBREX ) 200 MG capsule, Take 1 capsule (200 mg total) by mouth 2 (two) times  daily., Disp: 60 capsule, Rfl: 2  Observations/Objective: Patient is well-developed, well-nourished in no acute distress.  Resting comfortably at home.  Head is normocephalic, atraumatic.  No labored breathing.  Speech is clear and coherent with logical content.  Patient is alert and oriented at baseline.    Assessment and Plan: 1. At high risk for tick borne illness (Primary)  2. Tick bite of right hip,  initial encounter  - New tick bite with no concerning findings currently - Patient is allergic to Doxycycline  so will not prescribe Doxycycline  200mg  prophylactic dose - Discussed signs and symptoms of tick borne illness and advised him if any develop to seek immediate medical care in person due to his doxycycline  allergy   Follow Up Instructions: I discussed the assessment and treatment plan with the patient. The patient was provided an opportunity to ask questions and all were answered. The patient agreed with the plan and demonstrated an understanding of the instructions.  A copy of instructions were sent to the patient via MyChart unless otherwise noted below.    The patient was advised to call back or seek an in-person evaluation if the symptoms worsen or if the condition fails to improve as anticipated.    Delon CHRISTELLA Dickinson, PA-C

## 2024-01-01 NOTE — Patient Instructions (Signed)
 Jared Delacruz, thank you for joining Delon CHRISTELLA Dickinson, PA-C for today's virtual visit.  While this provider is not your primary care provider (PCP), if your PCP is located in our provider database this encounter information will be shared with them immediately following your visit.   A [facility] MyChart account gives you access to today's visit and all your visits, tests, and labs performed at [facility]  click here if you don't have a [facility] MyChart account or go to [email or web address]  Consent: (Patient) Jared Delacruz provided verbal consent for this virtual visit at the beginning of the encounter.  Current Medications:  Current Outpatient Medications:    celecoxib  (CELEBREX ) 200 MG capsule, Take 1 capsule (200 mg total) by mouth 2 (two) times daily., Disp: 60 capsule, Rfl: 2   Medications ordered in this encounter:  No orders of the defined types were placed in this encounter.    *If you need refills on other medications prior to your next appointment, please contact your pharmacy*  Follow-Up: Call back or seek an in-person evaluation if the symptoms worsen or if the condition fails to improve as anticipated.  [facility] [phone]  Other Instructions Tick Bite Information, Adult  Ticks are insects that draw blood for food. They climb onto people and animals that brush against the leaves and grasses that they live in. They then bite and attach to the skin. Most ticks are harmless, but some ticks may carry germs that can cause disease. These germs are spread to a person through a bite. To lower your risk of getting a disease from a tick bite, make sure you: Take steps to prevent tick bites. Check for ticks after being outdoors where ticks live. Watch for symptoms of disease if a tick attached to you or if you think a tick bit you. How can I prevent tick bites? Take these steps to help prevent tick bites when you go outdoors in an area  where ticks live: Before you go outdoors: Wear long sleeves and long pants to protect your skin from ticks. Wear light-colored clothing so you can see ticks easier. Tuck your pant legs into your socks. Apply insect repellent that has DEET (20% or higher), picaridin, or IR3535 in it to the following areas: Any bare skin. Avoid areas around the eyes and mouth. Edges of clothing, like the top of your boots, the bottom of your pant legs, and your sleeve cuffs. Consider applying an insect repellant that contains permethrin. Follow the instructions on the label. Do not apply permethrin directly to the skin. Instead, apply to the following areas: Clothing and shoes. Outdoor gear and tents. When you are outdoors: Avoid walking through areas with long grass. If you are walking on a trail, stay in the middle of the trail so your skin, hair, and clothing do not touch the bushes. Check for ticks on your clothing, hair, and skin often while you are outdoors. Check again before you go inside. When you go indoors: Check your clothing for ticks. Tumble dry clothes in a dryer on high heat for at least 10 minutes. If clothes are damp, additional time may be needed. If clothes require washing, use hot water. Check your gear and pets. Shower soon after being outdoors. Check your body for ticks. Do a full body check using a mirror. Be sure to check your scalp, neck, armpits, waist, groin, and joint areas. These are the spots where ticks attach themselves most often. What is the  best way to remove a tick?  Remove the tick as soon as possible. Removing it can prevent germs from passing to your body. Do not remove the tick with your bare fingers. Do not try to remove a tick with heat, alcohol, petroleum jelly, or fingernail polish. These things can cause the tick to salivate and regurgitate into your bloodstream, increasing your risk of getting a disease. To remove a tick that is crawling on your skin: Go outside  and brush the tick off. Use tape or a lint roller. To remove a tick that is attached to your skin: Wash your hands. If you have gloves, put them on. Use a fine-tipped tweezer, curved forceps, or a tick-removal tool to gently grasp the tick as close to your skin and the tick's head as possible. Gently pull with a steady, upward, and even pressure until the tick lets go. While removing the tick: Take care to keep the tick's head attached to its body. Do not twist or jerk the tick. This can make the tick's head or mouth parts break off and stay in your skin. If this happens, try to remove the mouth parts with tweezers. If you cannot remove them, leave the area alone and let the skin heal. Do not squeeze or crush the tick's body. This could force disease-carrying fluids from the tick into your body. What should I do after removing a tick? Clean the bite area and your hands with soap and water, rubbing alcohol, or an iodine scrub. If an antiseptic cream or ointment is available, put a small amount on the bite area. Wash and disinfect any tools that you used to remove the tick. How should I dispose of a tick? To dispose of a live tick, use one of these methods: Place it in rubbing alcohol. Place it in a sealed bag or container, and throw it away. Wrap it tightly in tape, and throw it away. Flush it down the toilet. Where to find more information Centers for Disease Control and Prevention: GyrateAtrophy.si [place]. Environmental Protection Agency: RelocationNetworking.fi Contact a health care provider if: You have symptoms of a disease after a tick bite. Symptoms of a tick-borne disease can occur from moments after the tick bites to 30 days after a tick is removed. Symptoms include: Fever or chills. A red rash that makes a circle (bull's-eye rash) in the bite area. Redness and swelling in the bite area. Headache or stiff neck. Muscle, joint, or bone pain. Abnormal tiredness. Numbness in your legs  or trouble walking or moving your legs. Tender or swollen lymph glands. Abdominal pain, vomiting, diarrhea, or weight loss. Get help right away if: You are not able to remove a tick. You have muscle weakness or paralysis. Your symptoms get worse or you experience new symptoms. You find an engorged tick on your skin and you are in an area where there is a higher risk of disease from ticks. Summary Ticks may carry germs that can spread to a person through a bite. These germs can cause disease. Wear protective clothing and use insect repellent to prevent tick bites. Follow the instructions on the label. If you find a tick on your body, remove it as soon as possible. If the tick is attached, do not try to remove it with heat, alcohol, petroleum jelly, or fingernail polish. If you have symptoms of a disease after being bitten by a tick, contact a health care provider. This information is not intended to replace advice given to you  by your health care provider. Make sure you discuss any questions you have with your health care provider. Document Revised: 08/26/2021 Document Reviewed: 08/26/2021 Elsevier Patient Education  2024 [employer]   If you have been instructed to have an in-person evaluation today at a local Urgent Care facility, please use the link below. It will take you to a list of all of our available [facility], including address, phone number and hours of operation. Please do not delay care.  [facility]  If you or a family member do not have a primary care provider, use the link below to schedule a visit and establish care. When you choose a [facility] primary care physician or advanced practice provider, you gain a long-term partner in health. Find a Primary Care Provider  Learn more about [facility]'s in-office and virtual care options: [place] - Get Care Now

## 2024-01-04 DIAGNOSIS — Z111 Encounter for screening for respiratory tuberculosis: Secondary | ICD-10-CM | POA: Diagnosis not present

## 2024-01-06 DIAGNOSIS — Z111 Encounter for screening for respiratory tuberculosis: Secondary | ICD-10-CM | POA: Diagnosis not present

## 2024-02-11 DIAGNOSIS — F319 Bipolar disorder, unspecified: Secondary | ICD-10-CM | POA: Diagnosis not present

## 2024-02-11 DIAGNOSIS — F902 Attention-deficit hyperactivity disorder, combined type: Secondary | ICD-10-CM | POA: Diagnosis not present

## 2024-02-11 DIAGNOSIS — F411 Generalized anxiety disorder: Secondary | ICD-10-CM | POA: Diagnosis not present

## 2024-03-16 DIAGNOSIS — M2550 Pain in unspecified joint: Secondary | ICD-10-CM | POA: Diagnosis not present
